# Patient Record
Sex: Female | Born: 1940
Health system: Southern US, Community
[De-identification: ages and names within clinical notes are randomized; demographics above are authoritative.]

## PROBLEM LIST (undated history)

## (undated) DIAGNOSIS — M51369 Other intervertebral disc degeneration, lumbar region without mention of lumbar back pain or lower extremity pain: Secondary | ICD-10-CM

## (undated) DIAGNOSIS — C55 Malignant neoplasm of uterus, part unspecified: Secondary | ICD-10-CM

## (undated) HISTORY — PX: ABDOMINAL HYSTERECTOMY: SHX81

---

## 2008-10-28 ENCOUNTER — Ambulatory Visit: Payer: Self-pay | Admitting: Family Medicine

## 2008-12-04 ENCOUNTER — Ambulatory Visit: Payer: Self-pay | Admitting: Gastroenterology

## 2009-09-23 ENCOUNTER — Ambulatory Visit: Payer: Self-pay | Admitting: Surgery

## 2009-10-19 ENCOUNTER — Ambulatory Visit: Payer: Self-pay | Admitting: Surgery

## 2009-12-19 HISTORY — PX: BREAST EXCISIONAL BIOPSY: SUR124

## 2009-12-19 HISTORY — PX: BREAST BIOPSY: SHX20

## 2010-01-12 ENCOUNTER — Ambulatory Visit: Payer: Self-pay | Admitting: Family Medicine

## 2010-01-14 ENCOUNTER — Ambulatory Visit: Payer: Self-pay | Admitting: Family Medicine

## 2010-01-29 ENCOUNTER — Ambulatory Visit: Payer: Self-pay | Admitting: Surgery

## 2010-02-04 ENCOUNTER — Ambulatory Visit: Payer: Self-pay | Admitting: Surgery

## 2011-01-13 ENCOUNTER — Ambulatory Visit: Payer: Self-pay | Admitting: Family Medicine

## 2012-03-14 ENCOUNTER — Ambulatory Visit: Payer: Self-pay | Admitting: Family Medicine

## 2013-03-15 ENCOUNTER — Ambulatory Visit: Payer: Self-pay | Admitting: Family Medicine

## 2013-09-11 ENCOUNTER — Ambulatory Visit: Payer: Self-pay | Admitting: Family Medicine

## 2014-03-17 ENCOUNTER — Ambulatory Visit: Payer: Self-pay | Admitting: Family Medicine

## 2015-02-13 DIAGNOSIS — E784 Other hyperlipidemia: Secondary | ICD-10-CM | POA: Diagnosis not present

## 2015-02-13 DIAGNOSIS — Z79899 Other long term (current) drug therapy: Secondary | ICD-10-CM | POA: Diagnosis not present

## 2015-02-19 DIAGNOSIS — Z0000 Encounter for general adult medical examination without abnormal findings: Secondary | ICD-10-CM | POA: Diagnosis not present

## 2015-02-19 DIAGNOSIS — Z8739 Personal history of other diseases of the musculoskeletal system and connective tissue: Secondary | ICD-10-CM | POA: Diagnosis not present

## 2015-02-19 DIAGNOSIS — E785 Hyperlipidemia, unspecified: Secondary | ICD-10-CM | POA: Diagnosis not present

## 2015-02-19 DIAGNOSIS — Z1239 Encounter for other screening for malignant neoplasm of breast: Secondary | ICD-10-CM | POA: Diagnosis not present

## 2015-02-26 DIAGNOSIS — Z8739 Personal history of other diseases of the musculoskeletal system and connective tissue: Secondary | ICD-10-CM | POA: Diagnosis not present

## 2015-03-25 ENCOUNTER — Ambulatory Visit
Admit: 2015-03-25 | Disposition: A | Discharge status: Home / Self Care | Payer: Self-pay | Attending: Family Medicine | Admitting: Family Medicine

## 2015-03-25 DIAGNOSIS — Z1231 Encounter for screening mammogram for malignant neoplasm of breast: Secondary | ICD-10-CM | POA: Diagnosis not present

## 2016-02-16 DIAGNOSIS — Z0000 Encounter for general adult medical examination without abnormal findings: Secondary | ICD-10-CM | POA: Diagnosis not present

## 2016-02-16 DIAGNOSIS — E785 Hyperlipidemia, unspecified: Secondary | ICD-10-CM | POA: Diagnosis not present

## 2016-02-23 ENCOUNTER — Other Ambulatory Visit: Payer: Self-pay | Admitting: Family Medicine

## 2016-02-23 DIAGNOSIS — E785 Hyperlipidemia, unspecified: Secondary | ICD-10-CM | POA: Diagnosis not present

## 2016-02-23 DIAGNOSIS — Z23 Encounter for immunization: Secondary | ICD-10-CM | POA: Diagnosis not present

## 2016-02-23 DIAGNOSIS — Z0000 Encounter for general adult medical examination without abnormal findings: Secondary | ICD-10-CM | POA: Diagnosis not present

## 2016-02-23 DIAGNOSIS — Z1231 Encounter for screening mammogram for malignant neoplasm of breast: Secondary | ICD-10-CM

## 2016-02-23 DIAGNOSIS — Z6841 Body Mass Index (BMI) 40.0 and over, adult: Secondary | ICD-10-CM | POA: Diagnosis not present

## 2016-03-25 ENCOUNTER — Other Ambulatory Visit: Payer: Self-pay | Admitting: Family Medicine

## 2016-03-25 ENCOUNTER — Ambulatory Visit
Admission: RE | Admit: 2016-03-25 | Discharge: 2016-03-25 | Disposition: A | Discharge status: Home / Self Care | Payer: Commercial Managed Care - HMO | Source: Ambulatory Visit | Attending: Family Medicine | Admitting: Family Medicine

## 2016-03-25 DIAGNOSIS — Z1231 Encounter for screening mammogram for malignant neoplasm of breast: Secondary | ICD-10-CM | POA: Diagnosis not present

## 2017-04-06 DIAGNOSIS — Z8639 Personal history of other endocrine, nutritional and metabolic disease: Secondary | ICD-10-CM | POA: Diagnosis not present

## 2017-04-06 DIAGNOSIS — Z0000 Encounter for general adult medical examination without abnormal findings: Secondary | ICD-10-CM | POA: Diagnosis not present

## 2017-04-06 DIAGNOSIS — E785 Hyperlipidemia, unspecified: Secondary | ICD-10-CM | POA: Diagnosis not present

## 2017-04-13 DIAGNOSIS — L719 Rosacea, unspecified: Secondary | ICD-10-CM | POA: Diagnosis not present

## 2017-04-13 DIAGNOSIS — E785 Hyperlipidemia, unspecified: Secondary | ICD-10-CM | POA: Diagnosis not present

## 2017-04-13 DIAGNOSIS — Z6841 Body Mass Index (BMI) 40.0 and over, adult: Secondary | ICD-10-CM | POA: Diagnosis not present

## 2017-04-13 DIAGNOSIS — Z0000 Encounter for general adult medical examination without abnormal findings: Secondary | ICD-10-CM | POA: Diagnosis not present

## 2018-01-22 DIAGNOSIS — J209 Acute bronchitis, unspecified: Secondary | ICD-10-CM | POA: Diagnosis not present

## 2018-03-29 ENCOUNTER — Other Ambulatory Visit: Payer: Self-pay | Admitting: Family Medicine

## 2018-03-29 DIAGNOSIS — Z1231 Encounter for screening mammogram for malignant neoplasm of breast: Secondary | ICD-10-CM

## 2018-04-19 ENCOUNTER — Ambulatory Visit
Admission: RE | Admit: 2018-04-19 | Discharge: 2018-04-19 | Disposition: A | Discharge status: Home / Self Care | Payer: Medicare HMO | Source: Ambulatory Visit | Attending: Family Medicine | Admitting: Family Medicine

## 2018-04-19 DIAGNOSIS — Z1231 Encounter for screening mammogram for malignant neoplasm of breast: Secondary | ICD-10-CM | POA: Diagnosis not present

## 2018-05-10 DIAGNOSIS — E785 Hyperlipidemia, unspecified: Secondary | ICD-10-CM | POA: Diagnosis not present

## 2018-05-17 DIAGNOSIS — M545 Low back pain: Secondary | ICD-10-CM | POA: Diagnosis not present

## 2018-05-17 DIAGNOSIS — Z0000 Encounter for general adult medical examination without abnormal findings: Secondary | ICD-10-CM | POA: Diagnosis not present

## 2018-05-17 DIAGNOSIS — Z23 Encounter for immunization: Secondary | ICD-10-CM | POA: Diagnosis not present

## 2018-05-17 DIAGNOSIS — Z6841 Body Mass Index (BMI) 40.0 and over, adult: Secondary | ICD-10-CM | POA: Diagnosis not present

## 2018-05-17 DIAGNOSIS — E785 Hyperlipidemia, unspecified: Secondary | ICD-10-CM | POA: Diagnosis not present

## 2018-08-19 DIAGNOSIS — R350 Frequency of micturition: Secondary | ICD-10-CM | POA: Diagnosis not present

## 2018-08-19 DIAGNOSIS — N3090 Cystitis, unspecified without hematuria: Secondary | ICD-10-CM | POA: Diagnosis not present

## 2018-11-02 DIAGNOSIS — Z6841 Body Mass Index (BMI) 40.0 and over, adult: Secondary | ICD-10-CM | POA: Diagnosis not present

## 2018-11-02 DIAGNOSIS — Z1211 Encounter for screening for malignant neoplasm of colon: Secondary | ICD-10-CM | POA: Diagnosis not present

## 2018-12-04 ENCOUNTER — Emergency Department: Payer: Medicare HMO

## 2018-12-04 ENCOUNTER — Emergency Department
Admission: EM | Admit: 2018-12-04 | Discharge: 2018-12-04 | Disposition: A | Discharge status: Home / Self Care | Payer: Medicare HMO | Attending: Emergency Medicine | Admitting: Emergency Medicine

## 2018-12-04 ENCOUNTER — Encounter: Payer: Self-pay | Admitting: Emergency Medicine

## 2018-12-04 DIAGNOSIS — M79662 Pain in left lower leg: Secondary | ICD-10-CM | POA: Insufficient documentation

## 2018-12-04 DIAGNOSIS — M25562 Pain in left knee: Secondary | ICD-10-CM | POA: Diagnosis not present

## 2018-12-04 DIAGNOSIS — M79605 Pain in left leg: Secondary | ICD-10-CM

## 2018-12-04 LAB — CBC
HCT: 42.5 % (ref 36.0–46.0)
Hemoglobin: 13.7 g/dL (ref 12.0–15.0)
MCH: 30.4 pg (ref 26.0–34.0)
MCHC: 32.2 g/dL (ref 30.0–36.0)
MCV: 94.2 fL (ref 80.0–100.0)
NRBC: 0 % (ref 0.0–0.2)
Platelets: 239 10*3/uL (ref 150–400)
RBC: 4.51 MIL/uL (ref 3.87–5.11)
RDW: 13.8 % (ref 11.5–15.5)
WBC: 7.1 10*3/uL (ref 4.0–10.5)

## 2018-12-04 LAB — COMPREHENSIVE METABOLIC PANEL
ALBUMIN: 3.9 g/dL (ref 3.5–5.0)
ALT: 13 U/L (ref 0–44)
AST: 19 U/L (ref 15–41)
Alkaline Phosphatase: 56 U/L (ref 38–126)
Anion gap: 7 (ref 5–15)
BUN: 12 mg/dL (ref 8–23)
CO2: 27 mmol/L (ref 22–32)
CREATININE: 0.75 mg/dL (ref 0.44–1.00)
Calcium: 9.3 mg/dL (ref 8.9–10.3)
Chloride: 104 mmol/L (ref 98–111)
GFR calc Af Amer: >60 mL/min (ref >60)
GLUCOSE: 106 mg/dL — AB (ref 70–99)
POTASSIUM: 4.1 mmol/L (ref 3.5–5.1)
Sodium: 138 mmol/L (ref 135–145)
Total Bilirubin: 1 mg/dL (ref 0.3–1.2)
Total Protein: 7.3 g/dL (ref 6.5–8.1)

## 2018-12-04 MED ORDER — ACETAMINOPHEN 325 MG PO TABS
650.0000 mg | ORAL_TABLET | Freq: Once | ORAL | Status: AC
  Administered 2018-12-04: 650 mg via ORAL
  Filled 2018-12-04: qty 2

## 2018-12-04 NOTE — ED Notes (Signed)
Pt returns from US.

## 2018-12-04 NOTE — ED Notes (Signed)
First Nurse Note: Patient to ED via WC from [facility] with complaint of left lower leg pain X 5 days.  Alert and oriented, color good.

## 2018-12-04 NOTE — ED Provider Notes (Signed)
[facility] Emergency Department Provider Note   ____________________________________________    I have reviewed the triage vital signs and the nursing notes.   HISTORY  Chief Complaint Knee Pain and Leg Pain     HPI Angela Bass is a 77 y.o. female who presents with complaints of left leg pain.  Patient reports symptoms have been ongoing for about 4 days.  Denies injury to the area.  She complains of pain behind her left knee which radiates down her left calf.  She reports she did fall nearly a month ago down to her knees bilaterally.  But no injury at that time.  Does have a history of arthritis.  Never had a blood clot before.  No recent travel.  Has not take anything for this.  Pain is worse when walking  History reviewed. No pertinent past medical history.  There are no active problems to display for this patient.   Past Surgical History:  Procedure Laterality Date  . BREAST BIOPSY Left 2011   Neg    Prior to Admission medications   Not on File     Allergies Patient has no allergy information on record.  Family History  Problem Relation Age of Onset  . Breast cancer Mother [id number]    Social History Social History   Tobacco Use  . Smoking status: Not on file  Substance Use Topics  . Alcohol use: Not on file  . Drug use: Not on file    Review of Systems  Constitutional: No fever/chills Eyes: No visual changes.  ENT: No sore throat. Cardiovascular: Denies chest pain. Respiratory: Denies shortness of breath.  No pleurisy Gastrointestinal: No abdominal pain.  Genitourinary: Negative for dysuria. Musculoskeletal: As above Skin: Negative for redness Neurological: Negative for headaches or numbness   ____________________________________________   PHYSICAL EXAM:  VITAL SIGNS: ED Triage Vitals  Enc Vitals Group     BP 12/04/18 0846 123/74     Pulse Rate 12/04/18 0846 72     Resp 12/04/18 0846 20     Temp 12/04/18 0846  98.6 F (37 C)     Temp Source 12/04/18 0846 Oral     SpO2 12/04/18 0846 99 %     Weight 12/04/18 0847 82.1 kg (181 lb)     Height 12/04/18 0847 1.397 m (4\' 7" )     Head Circumference --      Peak Flow --      Pain Score 12/04/18 0847 3     Pain Loc --      Pain Edu? --      Excl. in GC? --     Constitutional: Alert and oriented. No acute distress. Pleasant and interactive  Head: Atraumatic. Nose: No congestion/rhinnorhea. Mouth/Throat: Mucous membranes are moist.    Cardiovascular: Normal rate, regular rhythm.Peri [place].  Good peripheral circulation. Respiratory: Normal respiratory effort.  No retractions.   Musculoskeletal: Left leg: No significant swelling or tenderness to the calf.  Warm and well perfused, 2+ DP pulses.  Mild tenderness posterior to the knee, no tenderness to the thigh.  No erythema bruising or lesions Neurologic:  Normal speech and language. No gross focal neurologic deficits are appreciated.  Skin:  Skin is warm, dry and intact. No rash noted. Psychiatric: Mood and affect are normal. Speech and behavior are normal.  ____________________________________________   LABS (all labs ordered are listed, but only abnormal results are displayed)  Labs Reviewed  COMPREHENSIVE METABOLIC PANEL - Abnormal; Notable for the following  components:      Result Value   Glucose, Bld 106 (*)    All other components within normal limits  CBC   ____________________________________________  EKG  None ____________________________________________  RADIOLOGY  Ultrasound ____________________________________________   PROCEDURES  Procedure(s) performed: No  Procedures   Critical Care performed: No ____________________________________________   INITIAL IMPRESSION / ASSESSMENT AND PLAN / ED COURSE  Pertinent labs & imaging results that were available during my care of the patient were reviewed by me and considered in my medical decision making (see chart for  details).  Patient presents with left posterior leg pain, suspicious for Baker's cyst versus DVT, pending ultrasound  Ultrasound negative for DVT or Baker's cyst.  Commended follow-up with orthopedics, supportive care    ____________________________________________   FINAL CLINICAL IMPRESSION(S) / ED DIAGNOSES  Final diagnoses:  Left leg pain  Acute pain of left knee        Note:  This document was prepared using Dragon voice recognition software and may include unintentional dictation errors.    Jene Every, MD 12/04/18 1210

## 2018-12-04 NOTE — ED Notes (Signed)
Patient transported to Ultrasound 

## 2018-12-04 NOTE — ED Notes (Signed)
Responded to call bell. Pt needed assistance with toileting. Pt able to ambulate to commode with assistance. Pt returned to bed and resting comfortably.

## 2018-12-04 NOTE — ED Triage Notes (Signed)
Pt reports severe pain in her left knee on Thursday on the back of her knee and then radiates down her leg when she walks. Pt states when sitting the pain does not bother her but when walking it hurts. Pt denies recent injuries, reports she fell on her knees a few weeks ago but does not think it is that. No redness or warmth noted to left leg. Pt reports went to [facility] and was told she needed to come to the ED.

## 2018-12-04 NOTE — ED Notes (Signed)
Pt presents to ED from home with c/c of L side lower extremity pain. Pt indicates that pain is located primarily behind her L knee and radiates down her L calf. Pain described as aching and rated 10/10. She denies distal numbness/tingling. Pt is ambulatory without assistance. Pt states she fell several days ago but was able to get up immediately after. Pt has taken tylenol for the last two days for pain. Last dose was 650mg  at 1900 yesterday afternoon.

## 2018-12-29 DIAGNOSIS — Z9849 Cataract extraction status, unspecified eye: Secondary | ICD-10-CM | POA: Diagnosis not present

## 2018-12-29 DIAGNOSIS — H04123 Dry eye syndrome of bilateral lacrimal glands: Secondary | ICD-10-CM | POA: Diagnosis not present

## 2018-12-29 DIAGNOSIS — H11153 Pinguecula, bilateral: Secondary | ICD-10-CM | POA: Diagnosis not present

## 2018-12-29 DIAGNOSIS — H5203 Hypermetropia, bilateral: Secondary | ICD-10-CM | POA: Diagnosis not present

## 2018-12-29 DIAGNOSIS — H52223 Regular astigmatism, bilateral: Secondary | ICD-10-CM | POA: Diagnosis not present

## 2018-12-29 DIAGNOSIS — H524 Presbyopia: Secondary | ICD-10-CM | POA: Diagnosis not present

## 2018-12-29 DIAGNOSIS — Z961 Presence of intraocular lens: Secondary | ICD-10-CM | POA: Diagnosis not present

## 2019-03-12 ENCOUNTER — Other Ambulatory Visit: Payer: Self-pay | Admitting: Family Medicine

## 2019-03-12 DIAGNOSIS — Z1231 Encounter for screening mammogram for malignant neoplasm of breast: Secondary | ICD-10-CM

## 2019-05-16 ENCOUNTER — Other Ambulatory Visit: Payer: Self-pay

## 2019-05-16 ENCOUNTER — Ambulatory Visit
Admission: RE | Admit: 2019-05-16 | Discharge: 2019-05-16 | Disposition: A | Discharge status: Home / Self Care | Payer: Medicare HMO | Source: Ambulatory Visit | Attending: Family Medicine | Admitting: Family Medicine

## 2019-05-16 DIAGNOSIS — Z1231 Encounter for screening mammogram for malignant neoplasm of breast: Secondary | ICD-10-CM | POA: Diagnosis not present

## 2019-05-17 DIAGNOSIS — Z0000 Encounter for general adult medical examination without abnormal findings: Secondary | ICD-10-CM | POA: Diagnosis not present

## 2019-05-17 DIAGNOSIS — E785 Hyperlipidemia, unspecified: Secondary | ICD-10-CM | POA: Diagnosis not present

## 2019-05-22 DIAGNOSIS — E785 Hyperlipidemia, unspecified: Secondary | ICD-10-CM | POA: Diagnosis not present

## 2019-05-22 DIAGNOSIS — L719 Rosacea, unspecified: Secondary | ICD-10-CM | POA: Diagnosis not present

## 2019-05-22 DIAGNOSIS — M25562 Pain in left knee: Secondary | ICD-10-CM | POA: Diagnosis not present

## 2020-03-27 DIAGNOSIS — E785 Hyperlipidemia, unspecified: Secondary | ICD-10-CM | POA: Diagnosis not present

## 2020-04-02 DIAGNOSIS — Z6841 Body Mass Index (BMI) 40.0 and over, adult: Secondary | ICD-10-CM | POA: Diagnosis not present

## 2020-04-02 DIAGNOSIS — M545 Low back pain: Secondary | ICD-10-CM | POA: Diagnosis not present

## 2020-04-02 DIAGNOSIS — E785 Hyperlipidemia, unspecified: Secondary | ICD-10-CM | POA: Diagnosis not present

## 2020-04-02 DIAGNOSIS — Z0000 Encounter for general adult medical examination without abnormal findings: Secondary | ICD-10-CM | POA: Diagnosis not present

## 2020-06-02 DIAGNOSIS — M5442 Lumbago with sciatica, left side: Secondary | ICD-10-CM | POA: Diagnosis not present

## 2020-06-18 ENCOUNTER — Other Ambulatory Visit: Payer: Self-pay | Admitting: Family Medicine

## 2020-06-18 DIAGNOSIS — Z1231 Encounter for screening mammogram for malignant neoplasm of breast: Secondary | ICD-10-CM

## 2020-07-03 ENCOUNTER — Ambulatory Visit
Admission: RE | Admit: 2020-07-03 | Discharge: 2020-07-03 | Disposition: A | Discharge status: Home / Self Care | Payer: Medicare HMO | Source: Ambulatory Visit | Attending: Family Medicine | Admitting: Family Medicine

## 2020-07-03 DIAGNOSIS — Z1231 Encounter for screening mammogram for malignant neoplasm of breast: Secondary | ICD-10-CM | POA: Diagnosis not present

## 2020-07-27 DIAGNOSIS — H524 Presbyopia: Secondary | ICD-10-CM | POA: Diagnosis not present

## 2021-04-13 IMAGING — MG DIGITAL SCREENING BILAT W/ TOMO W/ CAD
6 of 10 series · 6 of 30 positions shown · non-contrast
Comparison: Previous exam(s).

CLINICAL DATA: Screening.

EXAM:
DIGITAL SCREENING BILATERAL MAMMOGRAM WITH TOMO AND CAD

[L CC synth-2D]
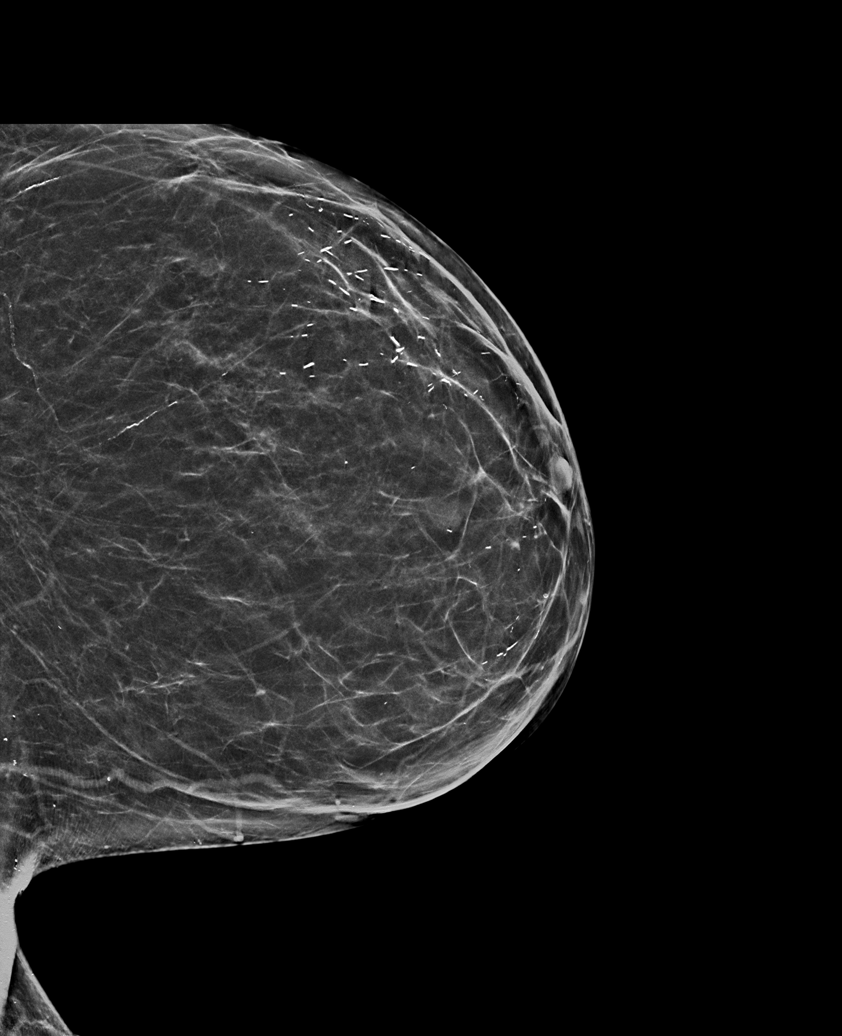

[R MLO synth-2D]
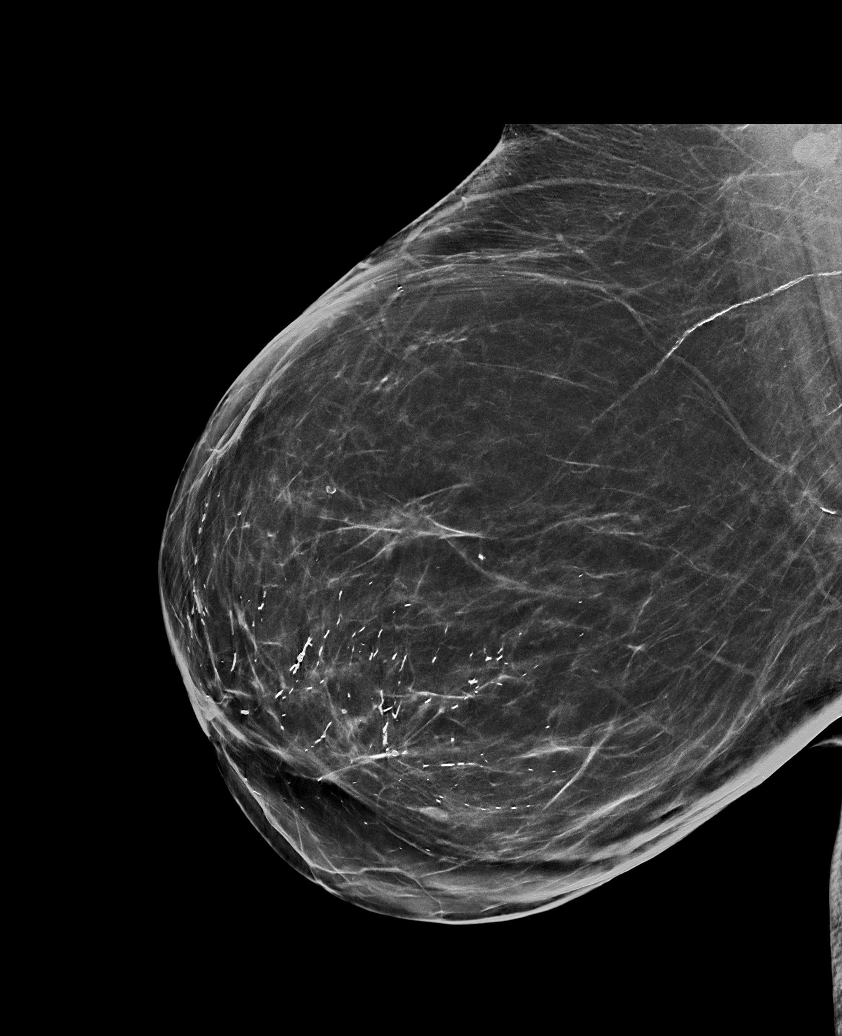

[L MLO synth-2D (1 of 2)]
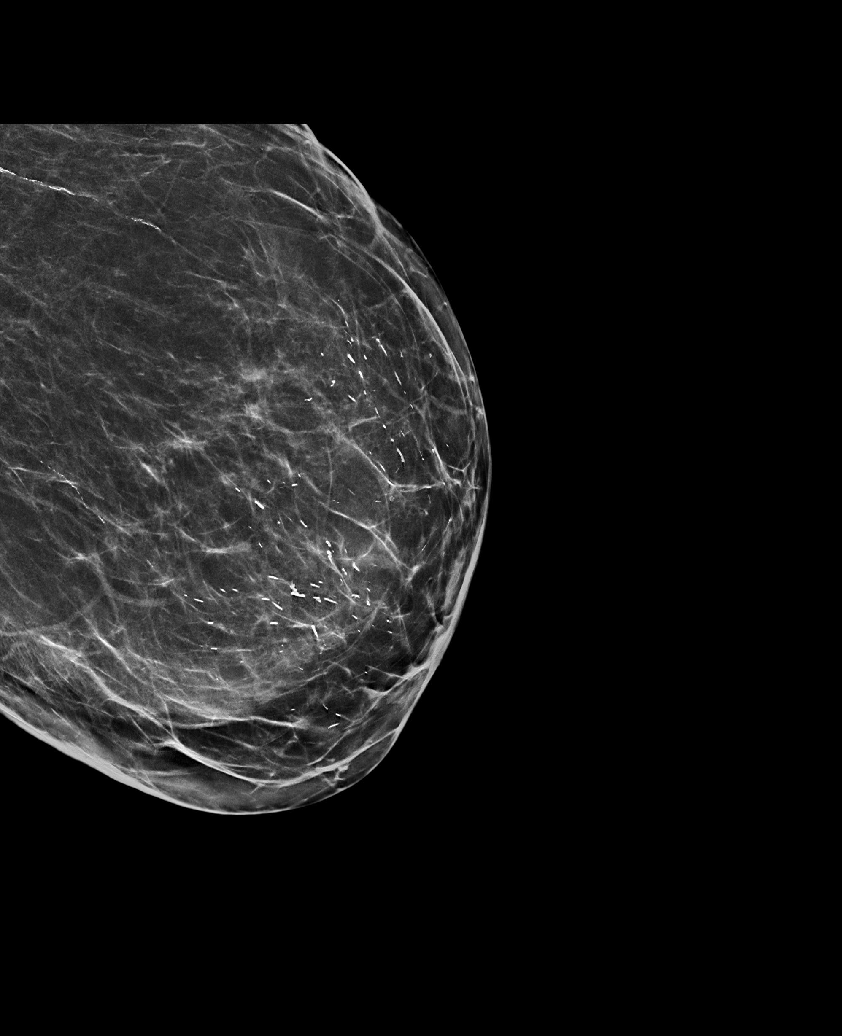

[R CC synth-2D]
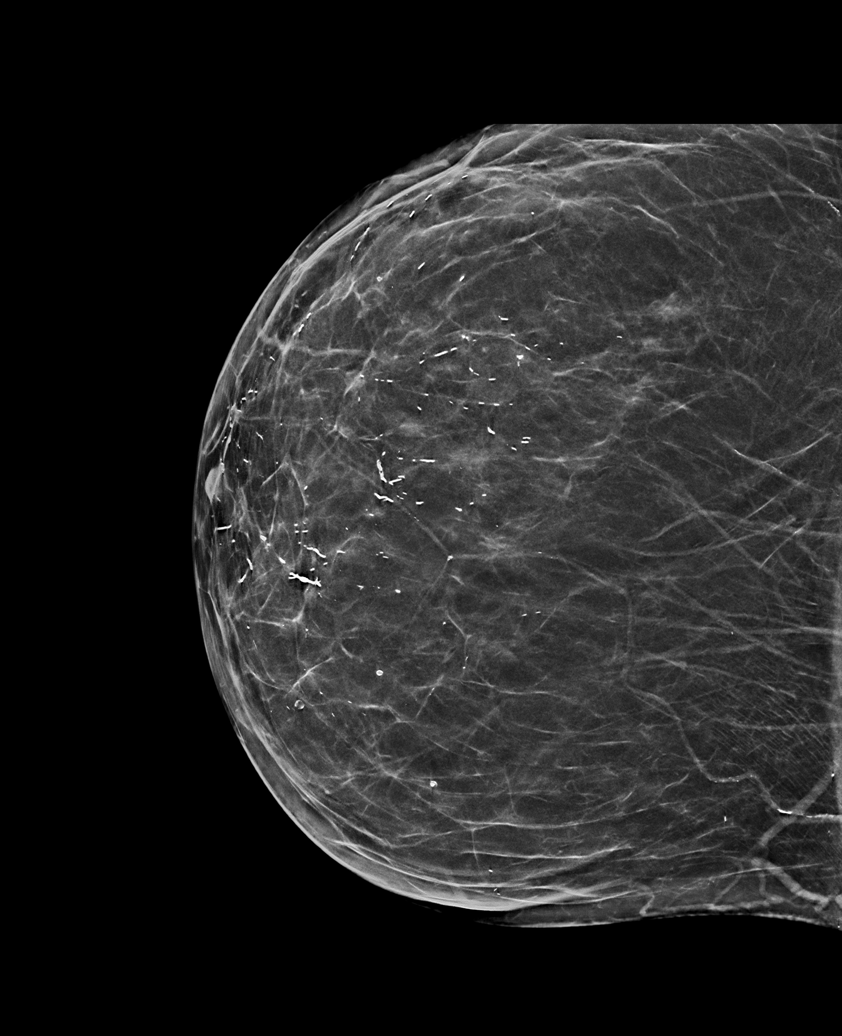

[L MLO synth-2D (2 of 2)]
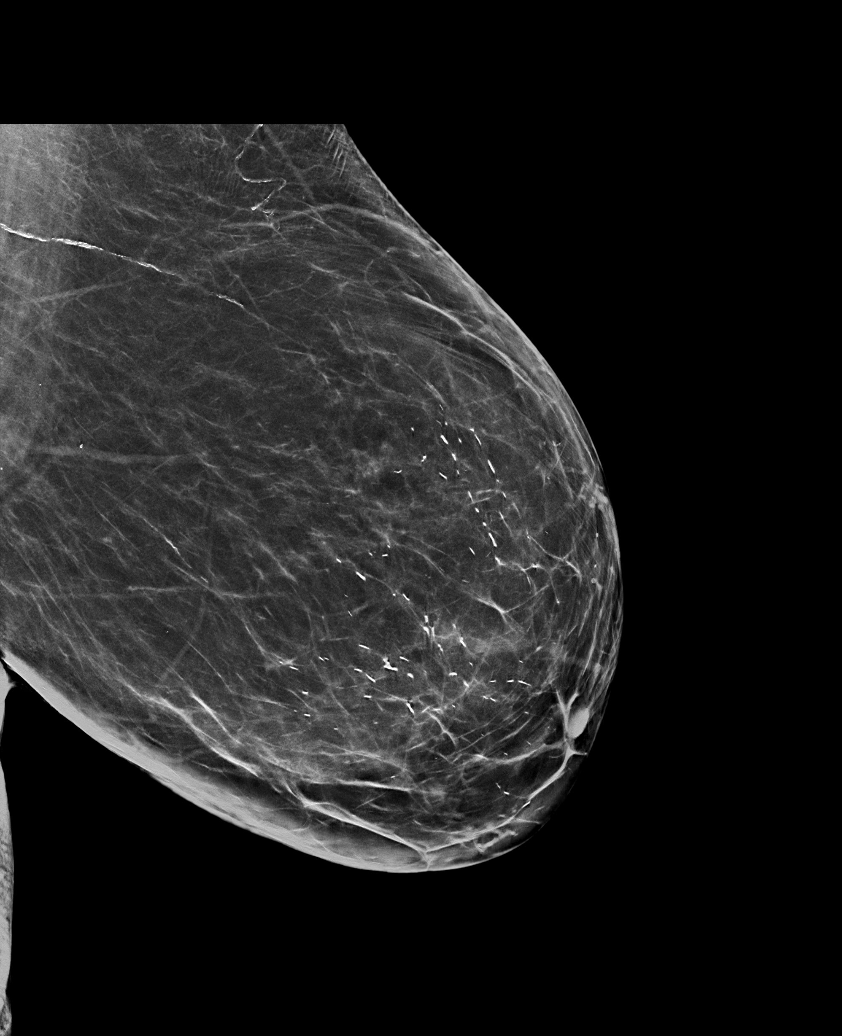

[R MLO tomo · tomo slice 39/78.0]
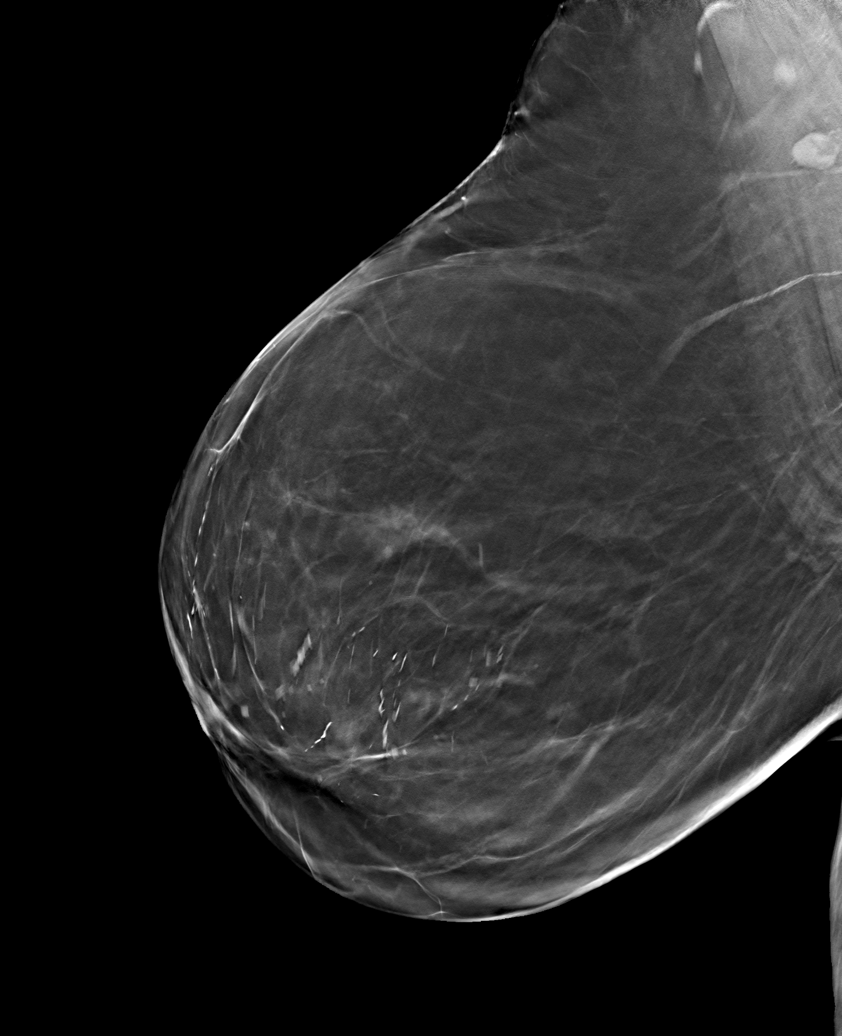

[6 of 30 positions shown; findings below may reference images not displayed]

ACR Breast Density Category b: There are scattered areas of
fibroglandular density.
FINDINGS: There are no findings suspicious for malignancy. Images were
processed with CAD.
IMPRESSION: No mammographic evidence of malignancy. A result letter of this
screening mammogram will be mailed directly to the patient.

RECOMMENDATION:
Screening mammogram in one year. (Code:[id number])

BI-RADS CATEGORY  1: Negative.

## 2021-08-10 ENCOUNTER — Other Ambulatory Visit: Payer: Self-pay | Admitting: Family Medicine

## 2021-08-26 DIAGNOSIS — E785 Hyperlipidemia, unspecified: Secondary | ICD-10-CM | POA: Diagnosis not present

## 2021-09-01 DIAGNOSIS — R5381 Other malaise: Secondary | ICD-10-CM | POA: Diagnosis not present

## 2021-09-01 DIAGNOSIS — Z0000 Encounter for general adult medical examination without abnormal findings: Secondary | ICD-10-CM | POA: Diagnosis not present

## 2021-09-01 DIAGNOSIS — R0602 Shortness of breath: Secondary | ICD-10-CM | POA: Diagnosis not present

## 2021-09-01 DIAGNOSIS — E785 Hyperlipidemia, unspecified: Secondary | ICD-10-CM | POA: Diagnosis not present

## 2021-09-01 DIAGNOSIS — R1013 Epigastric pain: Secondary | ICD-10-CM | POA: Diagnosis not present

## 2021-09-01 DIAGNOSIS — D649 Anemia, unspecified: Secondary | ICD-10-CM | POA: Diagnosis not present

## 2021-09-01 DIAGNOSIS — R5383 Other fatigue: Secondary | ICD-10-CM | POA: Diagnosis not present

## 2021-09-01 DIAGNOSIS — L719 Rosacea, unspecified: Secondary | ICD-10-CM | POA: Diagnosis not present

## 2021-09-07 DIAGNOSIS — D649 Anemia, unspecified: Secondary | ICD-10-CM | POA: Diagnosis not present

## 2021-11-16 DIAGNOSIS — D649 Anemia, unspecified: Secondary | ICD-10-CM | POA: Diagnosis not present

## 2021-11-16 DIAGNOSIS — R1013 Epigastric pain: Secondary | ICD-10-CM | POA: Diagnosis not present

## 2022-08-04 DIAGNOSIS — Z832 Family history of diseases of the blood and blood-forming organs and certain disorders involving the immune mechanism: Secondary | ICD-10-CM | POA: Diagnosis not present

## 2022-08-04 DIAGNOSIS — M5442 Lumbago with sciatica, left side: Secondary | ICD-10-CM | POA: Diagnosis not present

## 2022-08-04 DIAGNOSIS — K219 Gastro-esophageal reflux disease without esophagitis: Secondary | ICD-10-CM | POA: Diagnosis not present

## 2022-08-04 DIAGNOSIS — K449 Diaphragmatic hernia without obstruction or gangrene: Secondary | ICD-10-CM | POA: Diagnosis not present

## 2022-08-04 DIAGNOSIS — D509 Iron deficiency anemia, unspecified: Secondary | ICD-10-CM | POA: Diagnosis not present

## 2022-08-04 DIAGNOSIS — E785 Hyperlipidemia, unspecified: Secondary | ICD-10-CM | POA: Diagnosis not present

## 2022-08-04 DIAGNOSIS — R0602 Shortness of breath: Secondary | ICD-10-CM | POA: Diagnosis not present

## 2022-08-29 ENCOUNTER — Other Ambulatory Visit: Payer: Self-pay | Admitting: Internal Medicine

## 2022-08-29 DIAGNOSIS — R9389 Abnormal findings on diagnostic imaging of other specified body structures: Secondary | ICD-10-CM | POA: Diagnosis not present

## 2022-08-29 DIAGNOSIS — R079 Chest pain, unspecified: Secondary | ICD-10-CM

## 2022-08-29 DIAGNOSIS — G4733 Obstructive sleep apnea (adult) (pediatric): Secondary | ICD-10-CM | POA: Diagnosis not present

## 2022-08-29 DIAGNOSIS — R0602 Shortness of breath: Secondary | ICD-10-CM | POA: Diagnosis not present

## 2022-09-07 DIAGNOSIS — Z1331 Encounter for screening for depression: Secondary | ICD-10-CM | POA: Diagnosis not present

## 2022-09-07 DIAGNOSIS — M545 Low back pain, unspecified: Secondary | ICD-10-CM | POA: Diagnosis not present

## 2022-09-07 DIAGNOSIS — Z6841 Body Mass Index (BMI) 40.0 and over, adult: Secondary | ICD-10-CM | POA: Diagnosis not present

## 2022-09-07 DIAGNOSIS — R0602 Shortness of breath: Secondary | ICD-10-CM | POA: Diagnosis not present

## 2022-09-07 DIAGNOSIS — Z0000 Encounter for general adult medical examination without abnormal findings: Secondary | ICD-10-CM | POA: Diagnosis not present

## 2022-09-07 DIAGNOSIS — M79605 Pain in left leg: Secondary | ICD-10-CM | POA: Diagnosis not present

## 2022-09-07 DIAGNOSIS — Z23 Encounter for immunization: Secondary | ICD-10-CM | POA: Diagnosis not present

## 2022-09-07 DIAGNOSIS — E785 Hyperlipidemia, unspecified: Secondary | ICD-10-CM | POA: Diagnosis not present

## 2022-09-09 ENCOUNTER — Telehealth (HOSPITAL_COMMUNITY): Payer: Self-pay | Admitting: *Deleted

## 2022-09-09 ENCOUNTER — Other Ambulatory Visit (HOSPITAL_COMMUNITY): Payer: Self-pay | Admitting: *Deleted

## 2022-09-09 DIAGNOSIS — M545 Low back pain, unspecified: Secondary | ICD-10-CM | POA: Diagnosis not present

## 2022-09-09 MED ORDER — IVABRADINE HCL 7.5 MG PO TABS: ORAL_TABLET | ORAL | 0 refills | Status: DC

## 2022-09-09 MED ORDER — METOPROLOL TARTRATE 100 MG PO TABS: ORAL_TABLET | ORAL | 0 refills | Status: DC

## 2022-09-09 NOTE — Telephone Encounter (Signed)
Reaching out to patient to offer assistance regarding upcoming cardiac imaging study; pt verbalizes understanding of appt date/time, parking situation and where to check in, medications ordered, and verified current allergies; name and call back number provided for further questions should they arise  Gordy Clement RN Navigator Cardiac Imaging [facility] [phone] office [phone] cell  Patient to take 15mg  ivabradine + 100mg  metoprolol tartrate two hours prior to her cardiac CT scan.

## 2022-09-12 ENCOUNTER — Ambulatory Visit
Admission: RE | Admit: 2022-09-12 | Discharge: 2022-09-12 | Disposition: A | Discharge status: Home / Self Care | Payer: Medicare HMO | Source: Ambulatory Visit | Attending: Internal Medicine | Admitting: Internal Medicine

## 2022-09-12 DIAGNOSIS — R9389 Abnormal findings on diagnostic imaging of other specified body structures: Secondary | ICD-10-CM | POA: Diagnosis not present

## 2022-09-12 DIAGNOSIS — R079 Chest pain, unspecified: Secondary | ICD-10-CM | POA: Insufficient documentation

## 2022-09-12 DIAGNOSIS — I2510 Atherosclerotic heart disease of native coronary artery without angina pectoris: Secondary | ICD-10-CM | POA: Insufficient documentation

## 2022-09-12 MED ORDER — IOHEXOL 350 MG/ML SOLN
100.0000 mL | Freq: Once | INTRAVENOUS | Status: AC | PRN
  Administered 2022-09-12: 100 mL via INTRAVENOUS

## 2022-09-12 MED ORDER — NITROGLYCERIN 0.4 MG SL SUBL
0.8000 mg | SUBLINGUAL_TABLET | Freq: Once | SUBLINGUAL | Status: AC
  Administered 2022-09-12: 0.8 mg via SUBLINGUAL

## 2022-09-12 NOTE — Progress Notes (Signed)
Patient tolerated procedure well. Ambulate w/o difficulty. Denies any lightheadedness or being dizzy. Pt denies any pain at this time. Sitting in chair, pt is encouraged to drink additional water throughout the day and reason explained to patient. Patient verbalized understanding and all questions answered. ABC intact. No further needs at this time. Discharge from procedure area w/o issues.  

## 2022-09-14 ENCOUNTER — Other Ambulatory Visit: Payer: Self-pay | Admitting: Family Medicine

## 2022-09-14 DIAGNOSIS — M79605 Pain in left leg: Secondary | ICD-10-CM

## 2022-09-14 DIAGNOSIS — R937 Abnormal findings on diagnostic imaging of other parts of musculoskeletal system: Secondary | ICD-10-CM

## 2022-09-19 ENCOUNTER — Other Ambulatory Visit: Payer: Self-pay | Admitting: Family Medicine

## 2022-09-19 ENCOUNTER — Ambulatory Visit
Admission: RE | Admit: 2022-09-19 | Discharge: 2022-09-19 | Disposition: A | Discharge status: Home / Self Care | Payer: Medicare HMO | Source: Ambulatory Visit | Attending: Family Medicine | Admitting: Family Medicine

## 2022-09-19 DIAGNOSIS — M79605 Pain in left leg: Secondary | ICD-10-CM | POA: Diagnosis not present

## 2022-09-19 DIAGNOSIS — M549 Dorsalgia, unspecified: Secondary | ICD-10-CM | POA: Diagnosis not present

## 2022-09-19 DIAGNOSIS — M545 Low back pain, unspecified: Secondary | ICD-10-CM | POA: Diagnosis not present

## 2022-09-19 DIAGNOSIS — R937 Abnormal findings on diagnostic imaging of other parts of musculoskeletal system: Secondary | ICD-10-CM | POA: Diagnosis not present

## 2022-09-19 DIAGNOSIS — Z1231 Encounter for screening mammogram for malignant neoplasm of breast: Secondary | ICD-10-CM

## 2022-09-20 DIAGNOSIS — R0602 Shortness of breath: Secondary | ICD-10-CM | POA: Diagnosis not present

## 2022-09-20 DIAGNOSIS — G479 Sleep disorder, unspecified: Secondary | ICD-10-CM | POA: Diagnosis not present

## 2022-09-20 DIAGNOSIS — Z6841 Body Mass Index (BMI) 40.0 and over, adult: Secondary | ICD-10-CM | POA: Diagnosis not present

## 2022-09-28 DIAGNOSIS — R0602 Shortness of breath: Secondary | ICD-10-CM | POA: Diagnosis not present

## 2022-10-05 DIAGNOSIS — G479 Sleep disorder, unspecified: Secondary | ICD-10-CM | POA: Diagnosis not present

## 2022-10-05 DIAGNOSIS — R0609 Other forms of dyspnea: Secondary | ICD-10-CM | POA: Diagnosis not present

## 2022-10-05 DIAGNOSIS — Z6841 Body Mass Index (BMI) 40.0 and over, adult: Secondary | ICD-10-CM | POA: Diagnosis not present

## 2022-10-05 DIAGNOSIS — R058 Other specified cough: Secondary | ICD-10-CM | POA: Diagnosis not present

## 2022-10-07 DIAGNOSIS — R0609 Other forms of dyspnea: Secondary | ICD-10-CM | POA: Diagnosis not present

## 2022-10-14 ENCOUNTER — Ambulatory Visit
Admission: RE | Admit: 2022-10-14 | Discharge: 2022-10-14 | Disposition: A | Discharge status: Home / Self Care | Payer: Medicare HMO | Source: Ambulatory Visit | Attending: Family Medicine | Admitting: Family Medicine

## 2022-10-14 DIAGNOSIS — Z1231 Encounter for screening mammogram for malignant neoplasm of breast: Secondary | ICD-10-CM | POA: Diagnosis not present

## 2022-10-20 DIAGNOSIS — G4733 Obstructive sleep apnea (adult) (pediatric): Secondary | ICD-10-CM | POA: Diagnosis not present

## 2022-11-01 DIAGNOSIS — R0602 Shortness of breath: Secondary | ICD-10-CM | POA: Diagnosis not present

## 2022-11-01 DIAGNOSIS — G4734 Idiopathic sleep related nonobstructive alveolar hypoventilation: Secondary | ICD-10-CM | POA: Diagnosis not present

## 2022-11-01 DIAGNOSIS — G4733 Obstructive sleep apnea (adult) (pediatric): Secondary | ICD-10-CM | POA: Diagnosis not present

## 2022-11-09 DIAGNOSIS — Z6841 Body Mass Index (BMI) 40.0 and over, adult: Secondary | ICD-10-CM | POA: Diagnosis not present

## 2022-11-09 DIAGNOSIS — R0602 Shortness of breath: Secondary | ICD-10-CM | POA: Diagnosis not present

## 2022-11-09 DIAGNOSIS — R9389 Abnormal findings on diagnostic imaging of other specified body structures: Secondary | ICD-10-CM | POA: Diagnosis not present

## 2022-11-09 DIAGNOSIS — R079 Chest pain, unspecified: Secondary | ICD-10-CM | POA: Diagnosis not present

## 2022-11-09 DIAGNOSIS — G4733 Obstructive sleep apnea (adult) (pediatric): Secondary | ICD-10-CM | POA: Diagnosis not present

## 2022-11-14 DIAGNOSIS — M4807 Spinal stenosis, lumbosacral region: Secondary | ICD-10-CM | POA: Diagnosis not present

## 2022-11-14 DIAGNOSIS — M47816 Spondylosis without myelopathy or radiculopathy, lumbar region: Secondary | ICD-10-CM | POA: Diagnosis not present

## 2022-11-14 DIAGNOSIS — Z6841 Body Mass Index (BMI) 40.0 and over, adult: Secondary | ICD-10-CM | POA: Diagnosis not present

## 2022-11-15 ENCOUNTER — Other Ambulatory Visit: Payer: Self-pay | Admitting: Surgery

## 2022-11-15 DIAGNOSIS — M4807 Spinal stenosis, lumbosacral region: Secondary | ICD-10-CM

## 2022-11-15 DIAGNOSIS — M47816 Spondylosis without myelopathy or radiculopathy, lumbar region: Secondary | ICD-10-CM

## 2022-11-28 ENCOUNTER — Ambulatory Visit
Admission: RE | Admit: 2022-11-28 | Discharge: 2022-11-28 | Disposition: A | Discharge status: Home / Self Care | Payer: Medicare HMO | Source: Ambulatory Visit | Attending: Surgery | Admitting: Surgery

## 2022-11-28 DIAGNOSIS — M545 Low back pain, unspecified: Secondary | ICD-10-CM | POA: Diagnosis not present

## 2022-11-28 DIAGNOSIS — M4807 Spinal stenosis, lumbosacral region: Secondary | ICD-10-CM

## 2022-11-28 DIAGNOSIS — M47816 Spondylosis without myelopathy or radiculopathy, lumbar region: Secondary | ICD-10-CM

## 2022-12-14 DIAGNOSIS — M545 Low back pain, unspecified: Secondary | ICD-10-CM | POA: Diagnosis not present

## 2023-01-04 DIAGNOSIS — M5416 Radiculopathy, lumbar region: Secondary | ICD-10-CM | POA: Diagnosis not present

## 2023-01-04 DIAGNOSIS — M48062 Spinal stenosis, lumbar region with neurogenic claudication: Secondary | ICD-10-CM | POA: Diagnosis not present

## 2023-01-13 DIAGNOSIS — R300 Dysuria: Secondary | ICD-10-CM | POA: Diagnosis not present

## 2023-01-13 DIAGNOSIS — N10 Acute tubulo-interstitial nephritis: Secondary | ICD-10-CM | POA: Diagnosis not present

## 2023-01-13 DIAGNOSIS — M5442 Lumbago with sciatica, left side: Secondary | ICD-10-CM | POA: Diagnosis not present

## 2023-01-27 DIAGNOSIS — M47816 Spondylosis without myelopathy or radiculopathy, lumbar region: Secondary | ICD-10-CM | POA: Diagnosis not present

## 2023-01-27 DIAGNOSIS — M5442 Lumbago with sciatica, left side: Secondary | ICD-10-CM | POA: Diagnosis not present

## 2023-01-27 DIAGNOSIS — Z6841 Body Mass Index (BMI) 40.0 and over, adult: Secondary | ICD-10-CM | POA: Diagnosis not present

## 2023-02-01 DIAGNOSIS — M5416 Radiculopathy, lumbar region: Secondary | ICD-10-CM | POA: Diagnosis not present

## 2023-02-01 DIAGNOSIS — M48062 Spinal stenosis, lumbar region with neurogenic claudication: Secondary | ICD-10-CM | POA: Diagnosis not present

## 2023-05-03 DIAGNOSIS — G4733 Obstructive sleep apnea (adult) (pediatric): Secondary | ICD-10-CM | POA: Diagnosis not present

## 2023-05-03 DIAGNOSIS — R0609 Other forms of dyspnea: Secondary | ICD-10-CM | POA: Diagnosis not present

## 2023-12-13 ENCOUNTER — Emergency Department: Payer: Medicare HMO

## 2023-12-13 ENCOUNTER — Inpatient Hospital Stay
Admission: EM | Admit: 2023-12-13 | Discharge: 2023-12-18 | DRG: 327 | Disposition: A | Discharge status: Home / Self Care | Payer: Medicare HMO | Attending: Internal Medicine | Admitting: Internal Medicine

## 2023-12-13 ENCOUNTER — Other Ambulatory Visit: Payer: Self-pay

## 2023-12-13 DIAGNOSIS — I10 Essential (primary) hypertension: Secondary | ICD-10-CM | POA: Diagnosis present

## 2023-12-13 DIAGNOSIS — K56609 Unspecified intestinal obstruction, unspecified as to partial versus complete obstruction: Secondary | ICD-10-CM | POA: Diagnosis not present

## 2023-12-13 DIAGNOSIS — I878 Other specified disorders of veins: Secondary | ICD-10-CM | POA: Diagnosis not present

## 2023-12-13 DIAGNOSIS — K449 Diaphragmatic hernia without obstruction or gangrene: Secondary | ICD-10-CM

## 2023-12-13 DIAGNOSIS — R112 Nausea with vomiting, unspecified: Secondary | ICD-10-CM | POA: Diagnosis not present

## 2023-12-13 DIAGNOSIS — Z8542 Personal history of malignant neoplasm of other parts of uterus: Secondary | ICD-10-CM | POA: Diagnosis not present

## 2023-12-13 DIAGNOSIS — R1110 Vomiting, unspecified: Secondary | ICD-10-CM | POA: Diagnosis present

## 2023-12-13 DIAGNOSIS — J449 Chronic obstructive pulmonary disease, unspecified: Secondary | ICD-10-CM | POA: Diagnosis present

## 2023-12-13 DIAGNOSIS — E7800 Pure hypercholesterolemia, unspecified: Secondary | ICD-10-CM | POA: Diagnosis present

## 2023-12-13 DIAGNOSIS — K311 Adult hypertrophic pyloric stenosis: Secondary | ICD-10-CM | POA: Diagnosis not present

## 2023-12-13 DIAGNOSIS — Z803 Family history of malignant neoplasm of breast: Secondary | ICD-10-CM | POA: Diagnosis not present

## 2023-12-13 DIAGNOSIS — Z6841 Body Mass Index (BMI) 40.0 and over, adult: Secondary | ICD-10-CM

## 2023-12-13 DIAGNOSIS — K219 Gastro-esophageal reflux disease without esophagitis: Secondary | ICD-10-CM | POA: Diagnosis present

## 2023-12-13 DIAGNOSIS — I2510 Atherosclerotic heart disease of native coronary artery without angina pectoris: Secondary | ICD-10-CM | POA: Diagnosis present

## 2023-12-13 DIAGNOSIS — G8929 Other chronic pain: Secondary | ICD-10-CM | POA: Diagnosis present

## 2023-12-13 DIAGNOSIS — E876 Hypokalemia: Secondary | ICD-10-CM | POA: Insufficient documentation

## 2023-12-13 DIAGNOSIS — I700 Atherosclerosis of aorta: Secondary | ICD-10-CM | POA: Diagnosis not present

## 2023-12-13 DIAGNOSIS — R1111 Vomiting without nausea: Secondary | ICD-10-CM | POA: Diagnosis not present

## 2023-12-13 DIAGNOSIS — Z90710 Acquired absence of both cervix and uterus: Secondary | ICD-10-CM | POA: Diagnosis not present

## 2023-12-13 DIAGNOSIS — R918 Other nonspecific abnormal finding of lung field: Secondary | ICD-10-CM | POA: Diagnosis not present

## 2023-12-13 DIAGNOSIS — J9811 Atelectasis: Secondary | ICD-10-CM | POA: Diagnosis not present

## 2023-12-13 DIAGNOSIS — K440 Diaphragmatic hernia with obstruction, without gangrene: Principal | ICD-10-CM

## 2023-12-13 DIAGNOSIS — Z4682 Encounter for fitting and adjustment of non-vascular catheter: Secondary | ICD-10-CM | POA: Diagnosis not present

## 2023-12-13 DIAGNOSIS — R110 Nausea: Secondary | ICD-10-CM | POA: Diagnosis not present

## 2023-12-13 DIAGNOSIS — K3189 Other diseases of stomach and duodenum: Secondary | ICD-10-CM

## 2023-12-13 DIAGNOSIS — R509 Fever, unspecified: Secondary | ICD-10-CM | POA: Diagnosis not present

## 2023-12-13 DIAGNOSIS — E860 Dehydration: Secondary | ICD-10-CM | POA: Diagnosis not present

## 2023-12-13 DIAGNOSIS — N3289 Other specified disorders of bladder: Secondary | ICD-10-CM | POA: Diagnosis not present

## 2023-12-13 DIAGNOSIS — E669 Obesity, unspecified: Secondary | ICD-10-CM | POA: Diagnosis not present

## 2023-12-13 DIAGNOSIS — K429 Umbilical hernia without obstruction or gangrene: Secondary | ICD-10-CM | POA: Diagnosis not present

## 2023-12-13 HISTORY — DX: Malignant neoplasm of uterus, part unspecified: C55

## 2023-12-13 HISTORY — DX: Other intervertebral disc degeneration, lumbar region without mention of lumbar back pain or lower extremity pain: M51.369

## 2023-12-13 LAB — COMPREHENSIVE METABOLIC PANEL
ALT: 14 U/L (ref 0–44)
AST: 27 U/L (ref 15–41)
Albumin: 4 g/dL (ref 3.5–5.0)
Alkaline Phosphatase: 65 U/L (ref 38–126)
Anion gap: 17 — ABNORMAL HIGH (ref 5–15)
BUN: 10 mg/dL (ref 8–23)
CO2: 25 mmol/L (ref 22–32)
Calcium: 9.2 mg/dL (ref 8.9–10.3)
Chloride: 96 mmol/L — ABNORMAL LOW (ref 98–111)
Creatinine, Ser: 0.74 mg/dL (ref 0.44–1.00)
GFR, Estimated: >60 mL/min (ref >60)
Glucose, Bld: 142 mg/dL — ABNORMAL HIGH (ref 70–99)
Potassium: 3.7 mmol/L (ref 3.5–5.1)
Sodium: 138 mmol/L (ref 135–145)
Total Bilirubin: 1.4 mg/dL — ABNORMAL HIGH (ref <1.2)
Total Protein: 7.3 g/dL (ref 6.5–8.1)

## 2023-12-13 LAB — URINALYSIS, ROUTINE W REFLEX MICROSCOPIC
Bilirubin Urine: NEGATIVE
Glucose, UA: NEGATIVE mg/dL
Hgb urine dipstick: NEGATIVE
Ketones, ur: 20 mg/dL — AB
Leukocytes,Ua: NEGATIVE
Nitrite: NEGATIVE
Protein, ur: 100 mg/dL — AB
Specific Gravity, Urine: 1.024 (ref 1.005–1.030)
pH: 5 (ref 5.0–8.0)

## 2023-12-13 LAB — CBC
HCT: 37.6 % (ref 36.0–46.0)
Hemoglobin: 13 g/dL (ref 12.0–15.0)
MCH: 32.7 pg (ref 26.0–34.0)
MCHC: 34.6 g/dL (ref 30.0–36.0)
MCV: 94.7 fL (ref 80.0–100.0)
Platelets: 264 10*3/uL (ref 150–400)
RBC: 3.97 MIL/uL (ref 3.87–5.11)
RDW: 13.3 % (ref 11.5–15.5)
WBC: 11.6 10*3/uL — ABNORMAL HIGH (ref 4.0–10.5)
nRBC: 0 % (ref 0.0–0.2)

## 2023-12-13 LAB — TSH: TSH: 3.452 u[IU]/mL (ref 0.350–4.500)

## 2023-12-13 LAB — LIPASE, BLOOD: Lipase: 26 U/L (ref 11–51)

## 2023-12-13 LAB — LACTIC ACID, PLASMA: Lactic Acid, Venous: 0.6 mmol/L (ref 0.5–1.9)

## 2023-12-13 MED ORDER — SODIUM CHLORIDE 0.9 % IV BOLUS
1000.0000 mL | Freq: Once | INTRAVENOUS | Status: AC
  Administered 2023-12-13: 1000 mL via INTRAVENOUS

## 2023-12-13 MED ORDER — ACETAMINOPHEN 10 MG/ML IV SOLN
1000.0000 mg | Freq: Four times a day (QID) | INTRAVENOUS | Status: AC
  Administered 2023-12-13 – 2023-12-14 (×4): 1000 mg via INTRAVENOUS
  Filled 2023-12-13 (×5): qty 100

## 2023-12-13 MED ORDER — IOHEXOL 300 MG/ML  SOLN
100.0000 mL | Freq: Once | INTRAMUSCULAR | Status: AC | PRN
  Administered 2023-12-13: 100 mL via INTRAVENOUS

## 2023-12-13 MED ORDER — ONDANSETRON HCL 4 MG/2ML IJ SOLN
4.0000 mg | INTRAMUSCULAR | Status: AC
  Administered 2023-12-13: 4 mg via INTRAVENOUS
  Filled 2023-12-13: qty 2

## 2023-12-13 MED ORDER — SODIUM CHLORIDE 0.9 % IV SOLN: INTRAVENOUS | Status: DC

## 2023-12-13 MED ORDER — LABETALOL HCL 5 MG/ML IV SOLN: 10.0000 mg | INTRAVENOUS | Status: DC | PRN

## 2023-12-13 MED ORDER — PANTOPRAZOLE SODIUM 40 MG IV SOLR
40.0000 mg | INTRAVENOUS | Status: DC
  Administered 2023-12-13 – 2023-12-17 (×5): 40 mg via INTRAVENOUS
  Filled 2023-12-13 (×5): qty 10

## 2023-12-13 MED ORDER — ACETAMINOPHEN 325 MG PO TABS: 650.0000 mg | ORAL_TABLET | Freq: Four times a day (QID) | ORAL | Status: DC | PRN

## 2023-12-13 MED ORDER — HYDROMORPHONE HCL 1 MG/ML IJ SOLN: 0.5000 mg | INTRAMUSCULAR | Status: DC | PRN

## 2023-12-13 MED ORDER — ENOXAPARIN SODIUM 40 MG/0.4ML IJ SOSY
40.0000 mg | PREFILLED_SYRINGE | INTRAMUSCULAR | Status: DC
  Administered 2023-12-13 – 2023-12-17 (×5): 40 mg via SUBCUTANEOUS
  Filled 2023-12-13 (×5): qty 0.4

## 2023-12-13 MED ORDER — ACETAMINOPHEN 650 MG RE SUPP: 650.0000 mg | Freq: Four times a day (QID) | RECTAL | Status: DC | PRN

## 2023-12-13 MED ORDER — ACETAMINOPHEN 10 MG/ML IV SOLN
1000.0000 mg | Freq: Four times a day (QID) | INTRAVENOUS | Status: DC
  Filled 2023-12-13 (×2): qty 100

## 2023-12-13 MED ORDER — ONDANSETRON HCL 4 MG/2ML IJ SOLN
4.0000 mg | Freq: Four times a day (QID) | INTRAMUSCULAR | Status: DC | PRN
  Administered 2023-12-13: 4 mg via INTRAVENOUS
  Filled 2023-12-13: qty 2

## 2023-12-13 NOTE — Plan of Care (Signed)

## 2023-12-13 NOTE — ED Notes (Signed)
Pt off to imaging when this RN went into room to give meds and bolus.

## 2023-12-13 NOTE — H&P (Signed)
History and Physical    Angela Bass YQM:[id number] DOB: 03-29-1941 DOA: 12/13/2023  PCP: Jerl Mina, MD (Confirm with patient/family/NH records and if not entered, this has to be entered at [facility] point of entry) Patient coming from: Home  I have personally briefly reviewed patient's old medical records in [facility]  Chief Complaint: N/V and belly hurts  HPI: Angela Bass is a 82 y.o. female with medical history significant of HTN, hiatal hernia, morbid obesity, presented with new onset of worsening vomiting abdominal pain.  Patient has been feeling nausea for the last 2 days with significant decreased p.o. intake.  Last night patient started to have vomiting stomach content x 7, none bile nonbloody, accommodated with severe cramping-like abdominal pain became constant this morning and came to ED.  No diarrhea no fever or chills.  No changes of medication recently.  ED Course: Temperature 99.4, pulse 85, blood pressure 120/80, O2 saturation 100% room air.  CT abdomen pelvis showed markedly dilated and fluid-filled stomach with rotation of the stomach along the long axis acute gastric volvulus.  Zofran, IV bolus of 1000 mL and pain medication given in the ED.  NG tube placed in as per general surgery  Review of Systems: As per HPI otherwise 14 point review of systems negative.    Past Medical History:  Diagnosis Date   DDD (degenerative disc disease), lumbar    walks with cane   Uterine cancer (HCC)    55 years ago    Past Surgical History:  Procedure Laterality Date   ABDOMINAL HYSTERECTOMY     BREAST EXCISIONAL BIOPSY Left 2011   Neg     reports that she has never smoked. She has never used smokeless tobacco. She reports that she does not currently use alcohol. She reports that she does not use drugs.  No Known Allergies  Family History  Problem Relation Age of Onset   Breast cancer Mother 24     Prior to Admission medications   Medication Sig Start  Date End Date Taking? Authorizing Provider  ivabradine (CORLANOR) 7.5 MG TABS tablet Take tablets (15mg ) TWO hours prior to your cardiac CT scan. 09/09/22   Debbe Odea, MD  metoprolol tartrate (LOPRESSOR) 100 MG tablet Take tablet (100mg ) TWO hours prior to your cardiac CT scan. 09/09/22   Debbe Odea, MD    Physical Exam: Vitals:   12/13/23 1200 12/13/23 1433 12/13/23 1530 12/13/23 1542  BP:  (!) 158/86 (!) 132/37   Pulse:  84    Resp:      Temp:    98.2 F (36.8 C)  TempSrc:      SpO2:  100%    Weight: 83.9 kg     Height: 4\' 7"  (1.397 m)       Constitutional: NAD, calm, comfortable Vitals:   12/13/23 1200 12/13/23 1433 12/13/23 1530 12/13/23 1542  BP:  (!) 158/86 (!) 132/37   Pulse:  84    Resp:      Temp:    98.2 F (36.8 C)  TempSrc:      SpO2:  100%    Weight: 83.9 kg     Height: 4\' 7"  (1.397 m)      Eyes: PERRL, lids and conjunctivae normal ENMT: Mucous membranes are dry. Posterior pharynx clear of any exudate or lesions.Normal dentition.  Neck: normal, supple, no masses, no thyromegaly Respiratory: clear to auscultation bilaterally, no wheezing, no crackles. Normal respiratory effort. No accessory muscle use.  Cardiovascular: Regular rate  and rhythm, no murmurs / rubs / gallops. No extremity edema. 2+ pedal pulses. No carotid bruits.  Abdomen: Distended, tenderness on epigastric area, no rebound no guarding, no masses palpated. No hepatosplenomegaly. Bowel sounds positive.  Musculoskeletal: no clubbing / cyanosis. No joint deformity upper and lower extremities. Good ROM, no contractures. Normal muscle tone.  Skin: no rashes, lesions, ulcers. No induration Neurologic: CN 2-12 grossly intact. Sensation intact, DTR normal. Strength 5/5 in all 4.  Psychiatric: Normal judgment and insight. Alert and oriented x 3. Normal mood.     Labs on Admission: I have personally reviewed following labs and imaging studies  CBC: Recent Labs  Lab 12/13/23 1236  WBC  11.6*  HGB 13.0  HCT 37.6  MCV 94.7  PLT 264   Basic Metabolic Panel: Recent Labs  Lab 12/13/23 1202  NA 138  K 3.7  CL 96*  CO2 25  GLUCOSE 142*  BUN 10  CREATININE 0.74  CALCIUM 9.2   GFR: Estimated Creatinine Clearance: 47.8 mL/min (by C-G formula based on SCr of 0.74 mg/dL). Liver Function Tests: Recent Labs  Lab 12/13/23 1202  AST 27  ALT 14  ALKPHOS 65  BILITOT 1.4*  PROT 7.3  ALBUMIN 4.0   Recent Labs  Lab 12/13/23 1202  LIPASE 26   No results for input(s): "AMMONIA" in the last 168 hours. Coagulation Profile: No results for input(s): "INR", "PROTIME" in the last 168 hours. Cardiac Enzymes: No results for input(s): "CKTOTAL", "CKMB", "CKMBINDEX", "TROPONINI" in the last 168 hours. BNP (last 3 results) No results for input(s): "PROBNP" in the last 8760 hours. HbA1C: No results for input(s): "HGBA1C" in the last 72 hours. CBG: No results for input(s): "GLUCAP" in the last 168 hours. Lipid Profile: No results for input(s): "CHOL", "HDL", "LDLCALC", "TRIG", "CHOLHDL", "LDLDIRECT" in the last 72 hours. Thyroid Function Tests: No results for input(s): "TSH", "T4TOTAL", "FREET4", "T3FREE", "THYROIDAB" in the last 72 hours. Anemia Panel: No results for input(s): "VITAMINB12", "FOLATE", "FERRITIN", "TIBC", "IRON", "RETICCTPCT" in the last 72 hours. Urine analysis:    Component Value Date/Time   COLORURINE YELLOW (A) 12/13/2023 1236   APPEARANCEUR HAZY (A) 12/13/2023 1236   LABSPEC 1.024 12/13/2023 1236   PHURINE 5.0 12/13/2023 1236   GLUCOSEU NEGATIVE 12/13/2023 1236   HGBUR NEGATIVE 12/13/2023 1236   BILIRUBINUR NEGATIVE 12/13/2023 1236   KETONESUR 20 (A) 12/13/2023 1236   PROTEINUR 100 (A) 12/13/2023 1236   NITRITE NEGATIVE 12/13/2023 1236   LEUKOCYTESUR NEGATIVE 12/13/2023 1236    Radiological Exams on Admission: CT ABDOMEN PELVIS W CONTRAST Result Date: 12/13/2023 CLINICAL DATA:  Bowel obstruction suspected.  Vomiting. EXAM: CT ABDOMEN AND  PELVIS WITH CONTRAST TECHNIQUE: Multidetector CT imaging of the abdomen and pelvis was performed using the standard protocol following bolus administration of intravenous contrast. RADIATION DOSE REDUCTION: This exam was performed according to the departmental dose-optimization program which includes automated exposure control, adjustment of the mA and/or kV according to patient size and/or use of iterative reconstruction technique. CONTRAST:  OMNIPAQUE IOHEXOL 300 MG/ML  SOLN COMPARISON:  CT scan thoracic spine from 09/19/2022. FINDINGS: Lower chest: There are patchy the atelectatic changes in the visualized lung bases. No overt consolidation. No pleural effusion. The heart is normal in size. No pericardial effusion. Hepatobiliary: The liver is normal in size. Non-cirrhotic configuration. No suspicious mass. No intrahepatic or extrahepatic bile duct dilation. No calcified gallstones. Normal gallbladder wall thickness. No pericholecystic inflammatory changes. Pancreas: Unremarkable. No pancreatic ductal dilatation or surrounding inflammatory changes. Spleen: Within  normal limits. No focal lesion. Adrenals/Urinary Tract: Adrenal glands are unremarkable. No suspicious renal mass. No hydronephrosis. No renal or ureteric calculi. Urinary bladder is under distended, precluding optimal assessment. However, no large mass or stones identified. No perivesical fat stranding. Stomach/Bowel: There is markedly dilated and fluid-filled stomach. There is ingested food material and at least 4 medication tablets in the dependent portion of the stomach. Redemonstration of paraesophageal hernia. However, there is rotation of the stomach along its long axis with resultant reversal of the greater and lesser curvatures. The antrum is rotated anteriorly and superiorly while fundus is rotated posteriorly and inferiorly. However, there is no abnormal gastric wall thickening or surrounding fat stranding. The entire stomach is not  imaged on the prior thoracic spine or cardiac CT scan however, configuration of upper portion of the stomach appears grossly similar. No disproportionate dilation of the small or large bowel loops. No evidence of abnormal bowel wall thickening or inflammatory changes. The appendix was not visualized; however there is no acute inflammatory process in the right lower quadrant. There are multiple diverticula mainly in the left hemi colon, without imaging signs of diverticulitis. Vascular/Lymphatic: No ascites or pneumoperitoneum. No abdominal or pelvic lymphadenopathy, by size criteria. No aneurysmal dilation of the major abdominal arteries. There are mild peripheral atherosclerotic vascular calcifications of the aorta and its major branches. Reproductive: The uterus is surgically absent. No large adnexal mass. Other: Periumbilical surgical scar and tiny fat containing hernia noted. The soft tissues and abdominal wall are otherwise unremarkable. Musculoskeletal: No suspicious osseous lesions. There are mild - moderate multilevel degenerative changes in the visualized spine. IMPRESSION: *Markedly dilated and fluid-filled stomach with rotation of stomach along its long axis, as described in detail above, concerning for organo-axial volvulus. Correlate clinically. *Multiple other nonacute observations, as described above. Electronically Signed   By: Jules Schick M.D.   On: 12/13/2023 14:13    EKG: Pending  Assessment/Plan Principal Problem:   Gastric volvulus Active Problems:   Acute gastric volvulus  (please populate well all problems here in Problem List. (For example, if patient is on BP meds at home and you resume or decide to hold them, it is a problem that needs to be her. Same for CAD, COPD, HLD and so on)  Acute gastric volvulus -NGT, continuous suction to decompress -Supportive care including IVF, n.p.o., as needed Zofran and Dilaudid for symptoms -Review of labs showed no AKI, no lactic acidosis  so far clinically no strong evidence of acute gastric ischemia, will repeat lactic acid and other labs of CBC and BMP tomorrow -As per general surgery's recommendation, also consulted GI Dr. Servando Snare. -GI prophylaxis  HTN -Hold off home BP meds, start as needed labetalol  Morbid obesity -BMI= 43, recommend calorie control  DVT prophylaxis: Lovenox Code Status: Full code Family Communication: Daughter and granddaughter at bedside Disposition Plan: Stick with gastric volvulus, requiring NGT and inpatient GI and surgical consultation, expect more than 2 midnight hospital stay Consults called: General Surgeon Dr. Everlene Farrier and GI Dr. Servando Snare Admission status: Telemetry admission   Emeline General MD [facility] Pager [phone]  12/13/2023, 3:46 PM

## 2023-12-13 NOTE — ED Notes (Signed)
Pt called to bedside for Ice chips, This RN stated to let the EDP assess her first and would give ice after assessment.

## 2023-12-13 NOTE — ED Provider Notes (Signed)
[facility] Provider Note    Event Date/Time   First MD Initiated Contact with Patient 12/13/23 1258     (approximate)   History   Emesis and oliguria   HPI  Angela Bass is a 82 y.o. female reports no major past medical history other than high cholesterol  Yesterday she began exam is submitting nausea and vomiting.  Some very mild pain but when she knows what ever she tries to eat anything shortly thereafter she will get nauseated and vomit.  She has not had any fever or chills but felt like she might of had a low-grade temperature of 99 and reports when EMS arrived her temperature was about 100 Fahrenheit  No shortness of breath no chest pain.  Relates that she has had decreased urine output no appetite and has not had anything solid to eat.  She spent most the day in bed today with exception to episodes of vomiting where she was having slightly dark emesis.  Denies any previous abdominal surgeries.  No pain or burning with urination some discomfort along the right back     Physical Exam   Triage Vital Signs: ED Triage Vitals  Encounter Vitals Group     BP 12/13/23 1158 125/81     Systolic BP Percentile --      Diastolic BP Percentile --      Pulse Rate 12/13/23 1158 85     Resp 12/13/23 1158 16     Temp 12/13/23 1158 99.4 F (37.4 C)     Temp Source 12/13/23 1158 Oral     SpO2 12/13/23 1158 94 %     Weight 12/13/23 1200 185 lb (83.9 kg)     Height 12/13/23 1200 4\' 7"  (1.397 m)     Head Circumference --      Peak Flow --      Pain Score 12/13/23 1156 0     Pain Loc --      Pain Education --      Exclude from Growth Chart --     Most recent vital signs: Vitals:   12/13/23 1158 12/13/23 1433  BP: 125/81 (!) 158/86  Pulse: 85 84  Resp: 16   Temp: 99.4 F (37.4 C)   SpO2: 94% 100%     General: Awake, no distress.   membranes slightly dry.  Emesis bag in hand no active emesis CV:  Good peripheral perfusion.  Normal  tones Resp:  Normal effort.  Clear bilateral with normal work of breathing Abd:  No distention.  Moderate to fairly severe tenderness to palpation in the right upper quadrant epigastrium .  No rebound or guarding.  Remaining quadrants nontender. Other:  Warm well-perfused extremities.  She is fully awake and alert family also at the bedside all very pleasant   ED Results / Procedures / Treatments   Labs (all labs ordered are listed, but only abnormal results are displayed) Labs Reviewed  COMPREHENSIVE METABOLIC PANEL - Abnormal; Notable for the following components:      Result Value   Chloride 96 (*)    Glucose, Bld 142 (*)    Total Bilirubin 1.4 (*)    Anion gap 17 (*)    All other components within normal limits  URINALYSIS, ROUTINE W REFLEX MICROSCOPIC - Abnormal; Notable for the following components:   Color, Urine YELLOW (*)    APPearance HAZY (*)    Ketones, ur 20 (*)    Protein, ur 100 (*)  Bacteria, UA RARE (*)    All other components within normal limits  CBC - Abnormal; Notable for the following components:   WBC 11.6 (*)    All other components within normal limits  URINE CULTURE  LIPASE, BLOOD  LACTIC ACID, PLASMA     EKG  ED interpreted by me at 1230 heart rate 90 QRS 100 QTc 470   RADIOLOGY  Awaiting interpretation by radiologist but on my review of the patient's abdominal CT a large hiatal hernia is present with distended stomach   CT ABDOMEN PELVIS W CONTRAST Result Date: 12/13/2023 CLINICAL DATA:  Bowel obstruction suspected.  Vomiting. EXAM: CT ABDOMEN AND PELVIS WITH CONTRAST TECHNIQUE: Multidetector CT imaging of the abdomen and pelvis was performed using the standard protocol following bolus administration of intravenous contrast. RADIATION DOSE REDUCTION: This exam was performed according to the departmental dose-optimization program which includes automated exposure control, adjustment of the mA and/or kV according to patient size and/or use of  iterative reconstruction technique. CONTRAST:  OMNIPAQUE IOHEXOL 300 MG/ML  SOLN COMPARISON:  CT scan thoracic spine from 09/19/2022. FINDINGS: Lower chest: There are patchy the atelectatic changes in the visualized lung bases. No overt consolidation. No pleural effusion. The heart is normal in size. No pericardial effusion. Hepatobiliary: The liver is normal in size. Non-cirrhotic configuration. No suspicious mass. No intrahepatic or extrahepatic bile duct dilation. No calcified gallstones. Normal gallbladder wall thickness. No pericholecystic inflammatory changes. Pancreas: Unremarkable. No pancreatic ductal dilatation or surrounding inflammatory changes. Spleen: Within normal limits. No focal lesion. Adrenals/Urinary Tract: Adrenal glands are unremarkable. No suspicious renal mass. No hydronephrosis. No renal or ureteric calculi. Urinary bladder is under distended, precluding optimal assessment. However, no large mass or stones identified. No perivesical fat stranding. Stomach/Bowel: There is markedly dilated and fluid-filled stomach. There is ingested food material and at least 4 medication tablets in the dependent portion of the stomach. Redemonstration of paraesophageal hernia. However, there is rotation of the stomach along its long axis with resultant reversal of the greater and lesser curvatures. The antrum is rotated anteriorly and superiorly while fundus is rotated posteriorly and inferiorly. However, there is no abnormal gastric wall thickening or surrounding fat stranding. The entire stomach is not imaged on the prior thoracic spine or cardiac CT scan however, configuration of upper portion of the stomach appears grossly similar. No disproportionate dilation of the small or large bowel loops. No evidence of abnormal bowel wall thickening or inflammatory changes. The appendix was not visualized; however there is no acute inflammatory process in the right lower quadrant. There are multiple  diverticula mainly in the left hemi colon, without imaging signs of diverticulitis. Vascular/Lymphatic: No ascites or pneumoperitoneum. No abdominal or pelvic lymphadenopathy, by size criteria. No aneurysmal dilation of the major abdominal arteries. There are mild peripheral atherosclerotic vascular calcifications of the aorta and its major branches. Reproductive: The uterus is surgically absent. No large adnexal mass. Other: Periumbilical surgical scar and tiny fat containing hernia noted. The soft tissues and abdominal wall are otherwise unremarkable. Musculoskeletal: No suspicious osseous lesions. There are mild - moderate multilevel degenerative changes in the visualized spine. IMPRESSION: *Markedly dilated and fluid-filled stomach with rotation of stomach along its long axis, as described in detail above, concerning for organo-axial volvulus. Correlate clinically. *Multiple other nonacute observations, as described above. Electronically Signed   By: Jules Schick M.D.   On: 12/13/2023 14:13      PROCEDURES:  Critical Care performed: No  Procedures   MEDICATIONS ORDERED  IN ED: Medications  acetaminophen (OFIRMEV) IV 1,000 mg (0 mg Intravenous Stopped 12/13/23 1416)  sodium chloride 0.9 % bolus 1,000 mL (has no administration in time range)  sodium chloride 0.9 % bolus 1,000 mL (1,000 mLs Intravenous New Bag/Given 12/13/23 1358)  ondansetron (ZOFRAN) injection 4 mg (4 mg Intravenous Given 12/13/23 1401)  iohexol (OMNIPAQUE) 300 MG/ML solution 100 mL (100 mLs Intravenous Contrast Given 12/13/23 1329)  ondansetron (ZOFRAN) injection 4 mg (4 mg Intravenous Given 12/13/23 1443)     IMPRESSION / MDM / ASSESSMENT AND PLAN / ED COURSE  I reviewed the triage vital signs and the nursing notes.                              Differential diagnosis includes, but is not limited to, possible obstruction, cholelithiasis cholecystitis, diverticulitis, pyelonephritis, or other causation's.  Appears to  have an acute gastrointestinal illness without associated cardiopulmonary symptoms.  She does relate however a slight discomfort or a strange feeling in her chest that occurred right after the vomiting began around the time the vomiting began.  Patient's presentation is most consistent with acute complicated illness / injury requiring diagnostic workup.   ----------------------------------------- 3:14 PM on 12/13/2023 ----------------------------------------- Patient accepted in admission by Dr. Chipper Herb, Dr. Everlene Farrier also has evaluated, and is recommending nasogastric decompression.  Nursing to provide decompression and then obtain x-ray for verification thereafter.  Patient understanding agreeable with plan for admission and surgical consultation     FINAL CLINICAL IMPRESSION(S) / ED DIAGNOSES   Final diagnoses:  Gastrointestinal obstruction (HCC)     Rx / DC Orders   ED Discharge Orders     None        Note:  This document was prepared using Dragon voice recognition software and may include unintentional dictation errors.   Sharyn Creamer, MD 12/13/23 [phone]

## 2023-12-13 NOTE — ED Triage Notes (Signed)
Pt to ED from home [facility] for oliguria X 2 days  and vomiting (brown) since last night (over 10 times since last night).   Pt states last urination was this AM 0600 but very scant.   Per EMS, pt has UTI smell.  EMS VS: negative orthostatic VS, HR 80s, SPO2 94%, 140/68, CBG 178, 22# to R hand, 4mg  zofran given  Family concerned for dehydration. Pt states cannot keep water down.  Pt denies PMH.

## 2023-12-13 NOTE — Progress Notes (Signed)
82 year old with gastric outlet obstruction related to large paraesophageal hernia.  Although she does have some degree of volvulus this seems to be chronic and there is no evidence of ischemia within the stomach that will mandate emergent surgical intervention. Recommend admission for resuscitation, NG decompression.  I do think that we will probably have to perform repair int he acute setting but certainly not emergently.  Full consult to follow

## 2023-12-13 NOTE — ED Notes (Signed)
Urine, COVID swab and CBC send down with Pt labels.

## 2023-12-14 ENCOUNTER — Encounter
Admission: EM | Disposition: A | Discharge status: Home / Self Care | Payer: Self-pay | Source: Home / Self Care | Attending: Internal Medicine

## 2023-12-14 ENCOUNTER — Inpatient Hospital Stay: Payer: Medicare HMO | Admitting: Anesthesiology

## 2023-12-14 ENCOUNTER — Encounter: Payer: Self-pay | Admitting: Gastroenterology

## 2023-12-14 ENCOUNTER — Inpatient Hospital Stay: Payer: Medicare HMO

## 2023-12-14 DIAGNOSIS — R112 Nausea with vomiting, unspecified: Secondary | ICD-10-CM | POA: Diagnosis not present

## 2023-12-14 DIAGNOSIS — K3189 Other diseases of stomach and duodenum: Secondary | ICD-10-CM | POA: Diagnosis not present

## 2023-12-14 DIAGNOSIS — E876 Hypokalemia: Secondary | ICD-10-CM | POA: Insufficient documentation

## 2023-12-14 DIAGNOSIS — K449 Diaphragmatic hernia without obstruction or gangrene: Secondary | ICD-10-CM

## 2023-12-14 DIAGNOSIS — K311 Adult hypertrophic pyloric stenosis: Secondary | ICD-10-CM | POA: Diagnosis not present

## 2023-12-14 HISTORY — PX: ESOPHAGOGASTRODUODENOSCOPY: SHX5428

## 2023-12-14 LAB — BASIC METABOLIC PANEL
Anion gap: 10 (ref 5–15)
BUN: 11 mg/dL (ref 8–23)
CO2: 28 mmol/L (ref 22–32)
Calcium: 8.3 mg/dL — ABNORMAL LOW (ref 8.9–10.3)
Chloride: 100 mmol/L (ref 98–111)
Creatinine, Ser: 0.82 mg/dL (ref 0.44–1.00)
GFR, Estimated: >60 mL/min (ref >60)
Glucose, Bld: 106 mg/dL — ABNORMAL HIGH (ref 70–99)
Potassium: 3.2 mmol/L — ABNORMAL LOW (ref 3.5–5.1)
Sodium: 138 mmol/L (ref 135–145)

## 2023-12-14 LAB — LACTIC ACID, PLASMA: Lactic Acid, Venous: 1 mmol/L (ref 0.5–1.9)

## 2023-12-14 LAB — CBC
HCT: 34.1 % — ABNORMAL LOW (ref 36.0–46.0)
Hemoglobin: 11.4 g/dL — ABNORMAL LOW (ref 12.0–15.0)
MCH: 32.5 pg (ref 26.0–34.0)
MCHC: 33.4 g/dL (ref 30.0–36.0)
MCV: 97.2 fL (ref 80.0–100.0)
Platelets: 225 10*3/uL (ref 150–400)
RBC: 3.51 MIL/uL — ABNORMAL LOW (ref 3.87–5.11)
RDW: 13.8 % (ref 11.5–15.5)
WBC: 9.2 10*3/uL (ref 4.0–10.5)
nRBC: 0 % (ref 0.0–0.2)

## 2023-12-14 LAB — ABO/RH: ABO/RH(D): O POS

## 2023-12-14 LAB — TYPE AND SCREEN
ABO/RH(D): O POS
Antibody Screen: NEGATIVE

## 2023-12-14 SURGERY — ESOPHAGOGASTRODUODENOSCOPY (EGD) WITH PROPOFOL
Anesthesia: General

## 2023-12-14 SURGERY — EGD (ESOPHAGOGASTRODUODENOSCOPY)
Anesthesia: General

## 2023-12-14 MED ORDER — PROPOFOL 500 MG/50ML IV EMUL
INTRAVENOUS | Status: DC | PRN
  Administered 2023-12-14: 120 ug/kg/min via INTRAVENOUS

## 2023-12-14 MED ORDER — PROPOFOL 10 MG/ML IV BOLUS
INTRAVENOUS | Status: DC | PRN
  Administered 2023-12-14: 100 mg via INTRAVENOUS

## 2023-12-14 MED ORDER — ONDANSETRON HCL 4 MG/2ML IJ SOLN
INTRAMUSCULAR | Status: DC | PRN
  Administered 2023-12-14: 4 mg via INTRAVENOUS

## 2023-12-14 MED ORDER — POTASSIUM CHLORIDE 10 MEQ/100ML IV SOLN
10.0000 meq | Freq: Once | INTRAVENOUS | Status: AC
  Administered 2023-12-14: 10 meq via INTRAVENOUS
  Filled 2023-12-14: qty 100

## 2023-12-14 MED ORDER — LIDOCAINE HCL (CARDIAC) PF 100 MG/5ML IV SOSY
PREFILLED_SYRINGE | INTRAVENOUS | Status: DC | PRN
  Administered 2023-12-14: 80 mg via INTRAVENOUS

## 2023-12-14 MED ORDER — CHLORHEXIDINE GLUCONATE CLOTH 2 % EX PADS
6.0000 | MEDICATED_PAD | Freq: Every day | CUTANEOUS | Status: DC
Start: 2023-12-14 — End: 2023-12-16
  Administered 2023-12-15: 6 via TOPICAL

## 2023-12-14 MED ORDER — ONDANSETRON HCL 4 MG/2ML IJ SOLN
INTRAMUSCULAR | Status: AC
  Filled 2023-12-14: qty 2

## 2023-12-14 MED ORDER — SUCCINYLCHOLINE CHLORIDE 200 MG/10ML IV SOSY
PREFILLED_SYRINGE | INTRAVENOUS | Status: AC
  Filled 2023-12-14: qty 10

## 2023-12-14 MED ORDER — PROPOFOL 1000 MG/100ML IV EMUL
INTRAVENOUS | Status: AC
  Filled 2023-12-14: qty 100

## 2023-12-14 MED ORDER — DEXAMETHASONE SODIUM PHOSPHATE 10 MG/ML IJ SOLN
INTRAMUSCULAR | Status: AC
Start: 2023-12-14 — End: ?
  Filled 2023-12-14: qty 1

## 2023-12-14 MED ORDER — SODIUM CHLORIDE 0.9 % IV SOLN: INTRAVENOUS | Status: DC

## 2023-12-14 MED ORDER — DEXAMETHASONE SODIUM PHOSPHATE 10 MG/ML IJ SOLN
INTRAMUSCULAR | Status: DC | PRN
  Administered 2023-12-14: 5 mg via INTRAVENOUS

## 2023-12-14 MED ORDER — LIDOCAINE HCL (PF) 2 % IJ SOLN
INTRAMUSCULAR | Status: AC
  Filled 2023-12-14: qty 5

## 2023-12-14 MED ORDER — CEFAZOLIN SODIUM-DEXTROSE 2-4 GM/100ML-% IV SOLN
2.0000 g | Freq: Once | INTRAVENOUS | Status: AC
  Administered 2023-12-15: 2 g via INTRAVENOUS
  Filled 2023-12-14: qty 100

## 2023-12-14 MED ORDER — SUCCINYLCHOLINE CHLORIDE 200 MG/10ML IV SOSY
PREFILLED_SYRINGE | INTRAVENOUS | Status: DC | PRN
  Administered 2023-12-14: 100 mg via INTRAVENOUS

## 2023-12-14 MED ORDER — POTASSIUM CHLORIDE 2 MEQ/ML IV SOLN
INTRAVENOUS | Status: AC
  Filled 2023-12-14 (×3): qty 1000

## 2023-12-14 MED ORDER — KCL-LACTATED RINGERS 20 MEQ/L IV SOLN
INTRAVENOUS | Status: DC
  Filled 2023-12-14: qty 1000

## 2023-12-14 NOTE — Progress Notes (Signed)
  Progress Note   Patient: Angela Bass WGN:[id number] DOB: [birth year] DOA: 12/13/2023     1 DOS: the patient was seen and examined on 12/14/2023   Brief hospital course: KEKELI ORTMAN is a 82 y.o. female with medical history significant of HTN, hiatal hernia, morbid obesity, presented with new onset of worsening vomiting abdominal pain. CT abdomen pelvis showed markedly dilated and fluid-filled stomach with rotation of the stomach along the long axis acute gastric volvulus.    Principal Problem:   Gastric volvulus Active Problems:   Acute gastric volvulus   Partial gastric outlet obstruction   Paraesophageal hernia   Assessment and Plan: Gastric outlet obstruction. Giant hiatal hernia. Gastric volvulus. Patient has been seen by general surgery and GI.  EGD is performed today.  General surgery plan to take patient to surgery on Friday. Continue IV fluids and NG suction.  Essential hypertension. Continue and blood pressure medicines.  Morbid obesity. Continue to follow.  Hypokalemia. Supplemented through IV.  Recheck levels tomorrow.    Subjective:  Patient doing better today with NG suction.  No significant abdominal pain or nausea vomiting.  Physical Exam: Vitals:   12/13/23 1723 12/13/23 2042 12/14/23 0400 12/14/23 0745  BP: (!) 123/94 (!) 134/52 125/61 (!) 120/49  Pulse: 96 80 78 72  Resp: 16 16 20 16   Temp: 98.1 F (36.7 C) 98.1 F (36.7 C) 97.9 F (36.6 C) 98.6 F (37 C)  TempSrc: Oral Oral Oral Oral  SpO2: 90% 98% 98% 96%  Weight:      Height:       General exam: Appears calm and comfortable  Respiratory system: Clear to auscultation. Respiratory effort normal. Cardiovascular system: S1 & S2 heard, RRR. No JVD, murmurs, rubs, gallops or clicks. No pedal edema. Gastrointestinal system: Abdomen is nondistended, soft and nontender. No organomegaly or masses felt. Normal bowel sounds heard. Central nervous system: Alert and oriented. No focal  neurological deficits. Extremities: Symmetric 5 x 5 power. Skin: No rashes, lesions or ulcers Psychiatry: Judgement and insight appear normal. Mood & affect appropriate.    Data Reviewed:  CT scan and the lab results reviewed.  Family Communication: Daughter updated at the bedside.  Disposition: Status is: Inpatient Remains inpatient appropriate because: Severity of disease, IV treatment, inpatient procedures.     Time spent: 35 minutes  Author: Marrion Coy, MD 12/14/2023 12:05 PM  For on call review [email or web address].

## 2023-12-14 NOTE — Consult Note (Signed)
Midge Minium, MD [facility]  [address], [address] [place], [place] [address] Phone: [phone] Fax : [phone]  Consultation  Referring Provider:     Dr. Chipper Herb Primary Care Physician:  Jerl Mina, MD Primary Gastroenterologist: [facility]         Reason for Consultation:     Vomiting with gastric volvulus  Date of Admission:  12/13/2023 Date of Consultation:  12/14/2023         HPI:   Angela Bass is a 82 y.o. female who was admitted yesterday with nausea and vomiting.  The patient reported that she had pain anytime she ate and would get nauseated and vomit.  The patient states that the symptoms have been going on for 2 days prior.  She was vomiting up gastric contents.  The patient had a CT scan that showed:  IMPRESSION: *Markedly dilated and fluid-filled stomach with rotation of stomach along its long axis, as described in detail above, concerning for organo-axial volvulus. Correlate clinically. *Multiple other nonacute observations, as described above.  The patient had an NG tube placed for decompression and surgery had mentioned that there was no evidence of ischemia within the stomach wall and is planning to bring him to surgery tomorrow for repair.  I was consulted yesterday and spoke to the surgeon about doing an upper endoscopy preoperatively to make sure there are no strictures or narrowing of the esophagus nor other other things found to complicate the surgery.  Past Medical History:  Diagnosis Date   DDD (degenerative disc disease), lumbar    walks with cane   Uterine cancer (HCC)    55 years ago    Past Surgical History:  Procedure Laterality Date   ABDOMINAL HYSTERECTOMY     BREAST EXCISIONAL BIOPSY Left 2011   Neg    Prior to Admission medications   Medication Sig Start Date End Date Taking? Authorizing Provider  doxycycline (VIBRA-TABS) 100 MG tablet Take 100 mg by mouth daily. 12/01/23 02/29/24 Yes [provider]   simvastatin (ZOCOR) 20 MG tablet Take 1 tablet by mouth daily. 12/01/23  Yes [provider]  furosemide (LASIX) 20 MG tablet Take 20 mg by mouth daily. Patient not taking: Reported on 12/13/2023    [provider]  ivabradine (CORLANOR) 7.5 MG TABS tablet Take tablets (15mg ) TWO hours prior to your cardiac CT scan. Patient not taking: Reported on 12/13/2023 09/09/22   Debbe Odea, MD  metoprolol tartrate (LOPRESSOR) 100 MG tablet Take tablet (100mg ) TWO hours prior to your cardiac CT scan. Patient not taking: Reported on 12/13/2023 09/09/22   Debbe Odea, MD    Family History  Problem Relation Age of Onset   Breast cancer Mother 16     Social History   Tobacco Use   Smoking status: Never   Smokeless tobacco: Never  Vaping Use   Vaping status: Never Used  Substance Use Topics   Alcohol use: Not Currently   Drug use: Never    Allergies as of 12/13/2023   (No Known Allergies)    Review of Systems:    All systems reviewed and negative except where noted in HPI.   Physical Exam:  Vital signs in last 24 hours: Temp:  [97.9 F (36.6 C)-99.4 F (37.4 C)] 98.6 F (37 C) (12/26 0745) Pulse Rate:  [72-96] 72 (12/26 0745) Resp:  [16-20] 16 (12/26 0745) BP: (120-158)/(37-94) 120/49 (12/26 0745) SpO2:  [90 %-100 %] 96 % (12/26 0745) Weight:  [  83.9 kg] 83.9 kg (12/25 1200) Last BM Date : 12/11/23 General:   Pleasant, cooperative in NAD Head:  Normocephalic and atraumatic.  NG tube in place Eyes:   No icterus.   Conjunctiva pink. PERRLA. Ears:  Normal auditory acuity. Neck:  Supple; no masses or thyroidomegaly Lungs: Respirations even and unlabored. Lungs clear to auscultation bilaterally.   No wheezes, crackles, or rhonchi.  Heart:  Regular rate and rhythm;  Without murmur, clicks, rubs or gallops Abdomen:  Soft, nondistended, nontender. Normal bowel sounds. No appreciable masses or hepatomegaly.  No rebound or guarding.  Rectal:  Not  performed. Msk:  Symmetrical without gross deformities.    Extremities:  Without edema, cyanosis or clubbing. Neurologic:  Alert and oriented x3;  grossly normal neurologically. Skin:  Intact without significant lesions or rashes. Cervical Nodes:  No significant cervical adenopathy. Psych:  Alert and cooperative. Normal affect.  LAB RESULTS: Recent Labs    12/13/23 1236 12/14/23 0544  WBC 11.6* 9.2  HGB 13.0 11.4*  HCT 37.6 34.1*  PLT 264 225   BMET Recent Labs    12/13/23 1202 12/14/23 0544  NA 138 138  K 3.7 3.2*  CL 96* 100  CO2 25 28  GLUCOSE 142* 106*  BUN 10 11  CREATININE 0.74 0.82  CALCIUM 9.2 8.3*   LFT Recent Labs    12/13/23 1202  PROT 7.3  ALBUMIN 4.0  AST 27  ALT 14  ALKPHOS 65  BILITOT 1.4*   PT/INR No results for input(s): "LABPROT", "INR" in the last 72 hours.  STUDIES: DG ABD ACUTE 2+V W 1V CHEST Result Date: 12/14/2023 CLINICAL DATA:  [id number] with gastric outlet obstruction. EXAM: DG ABDOMEN ACUTE WITH 1 VIEW CHEST COMPARISON:  CT abdomen pelvis 12/13/2023 FINDINGS: There is no evidence of dilated bowel loops or free intraperitoneal air. No urinary calculi or other significant radiographic abnormality is seen. The CT demonstrated a large hiatal hernia eccentric to the right containing the gastric antrum and gastroduodenal junction with the fundus fluid dilated located in its normal position below the diaphragm. NGT has been inserted extends below the diaphragm and is coiled in the gastric fundus. Clustered on the adjusted densities in the ascending colon are again noted possibly undigested medication with additional similar clustered densities in the fundus of the stomach. There are multiple pelvic phleboliths. There is advanced hip DJD on the left-greater-than-right, with left acetabular protrusio. Heart size and mediastinal contours are within normal limits except as above. Both lungs are clear. IMPRESSION: 1. No evidence of bowel obstruction or  free air. 2. NGT coiled in the gastric fundus. 3. Large hiatal hernia eccentric to the right containing the gastric antrum and gastroduodenal junction on CT, with the fundus fluid-dilated located in its normal position below the diaphragm. 4. Clustered densities in the ascending colon and fundus of the stomach possibly undigested medication. 5. No acute chest findings. Electronically Signed   By: Almira Bar M.D.   On: 12/14/2023 07:42   DG Abdomen 1 View Result Date: 12/13/2023 CLINICAL DATA:  NG placement. EXAM: ABDOMEN - 1 VIEW COMPARISON:  CT abdomen pelvis dated 12/13/2023. FINDINGS: Enteric tube with tip in the left upper abdomen over the gastric air. Bibasilar pulmonary opacities, left greater than right. IMPRESSION: Enteric tube with tip in the stomach. Electronically Signed   By: Elgie Collard M.D.   On: 12/13/2023 16:14   CT ABDOMEN PELVIS W CONTRAST Result Date: 12/13/2023 CLINICAL DATA:  Bowel obstruction suspected.  Vomiting. EXAM: CT ABDOMEN  AND PELVIS WITH CONTRAST TECHNIQUE: Multidetector CT imaging of the abdomen and pelvis was performed using the standard protocol following bolus administration of intravenous contrast. RADIATION DOSE REDUCTION: This exam was performed according to the departmental dose-optimization program which includes automated exposure control, adjustment of the mA and/or kV according to patient size and/or use of iterative reconstruction technique. CONTRAST:  OMNIPAQUE IOHEXOL 300 MG/ML  SOLN COMPARISON:  CT scan thoracic spine from 09/19/2022. FINDINGS: Lower chest: There are patchy the atelectatic changes in the visualized lung bases. No overt consolidation. No pleural effusion. The heart is normal in size. No pericardial effusion. Hepatobiliary: The liver is normal in size. Non-cirrhotic configuration. No suspicious mass. No intrahepatic or extrahepatic bile duct dilation. No calcified gallstones. Normal gallbladder wall thickness. No pericholecystic  inflammatory changes. Pancreas: Unremarkable. No pancreatic ductal dilatation or surrounding inflammatory changes. Spleen: Within normal limits. No focal lesion. Adrenals/Urinary Tract: Adrenal glands are unremarkable. No suspicious renal mass. No hydronephrosis. No renal or ureteric calculi. Urinary bladder is under distended, precluding optimal assessment. However, no large mass or stones identified. No perivesical fat stranding. Stomach/Bowel: There is markedly dilated and fluid-filled stomach. There is ingested food material and at least 4 medication tablets in the dependent portion of the stomach. Redemonstration of paraesophageal hernia. However, there is rotation of the stomach along its long axis with resultant reversal of the greater and lesser curvatures. The antrum is rotated anteriorly and superiorly while fundus is rotated posteriorly and inferiorly. However, there is no abnormal gastric wall thickening or surrounding fat stranding. The entire stomach is not imaged on the prior thoracic spine or cardiac CT scan however, configuration of upper portion of the stomach appears grossly similar. No disproportionate dilation of the small or large bowel loops. No evidence of abnormal bowel wall thickening or inflammatory changes. The appendix was not visualized; however there is no acute inflammatory process in the right lower quadrant. There are multiple diverticula mainly in the left hemi colon, without imaging signs of diverticulitis. Vascular/Lymphatic: No ascites or pneumoperitoneum. No abdominal or pelvic lymphadenopathy, by size criteria. No aneurysmal dilation of the major abdominal arteries. There are mild peripheral atherosclerotic vascular calcifications of the aorta and its major branches. Reproductive: The uterus is surgically absent. No large adnexal mass. Other: Periumbilical surgical scar and tiny fat containing hernia noted. The soft tissues and abdominal wall are otherwise unremarkable.  Musculoskeletal: No suspicious osseous lesions. There are mild - moderate multilevel degenerative changes in the visualized spine. IMPRESSION: *Markedly dilated and fluid-filled stomach with rotation of stomach along its long axis, as described in detail above, concerning for organo-axial volvulus. Correlate clinically. *Multiple other nonacute observations, as described above. Electronically Signed   By: Jules Schick M.D.   On: 12/13/2023 14:13      Impression / Plan:   Assessment: Principal Problem:   Gastric volvulus Active Problems:   Acute gastric volvulus   Partial gastric outlet obstruction   Paraesophageal hernia   Angela Bass is a 82 y.o. y/o female with who came in with nausea and vomiting and a large paraesophageal hernia.  The patient is going for surgery tomorrow to have this repaired.  I have been consulted to take the patient for an upper endoscopy to rule out any strictures or narrowing or any other issues that may complicate the surgery.  Plan:  The patient will be set up for an EGD for today.  The patient and the family have been explained the plan.  The patient has been  told the risks and benefits of the procedure and agrees with having done.  Thank you for involving me in the care of this patient.      LOS: 1 day   Midge Minium, MD, [facility] 12/14/2023, 11:29 AM,  Pager [phone] 7am-5pm  Check AMION for 5pm -7am coverage and on weekends   Note: This dictation was prepared with Dragon dictation along with smaller phrase technology. Any transcriptional errors that result from this process are unintentional.

## 2023-12-14 NOTE — Consult Note (Signed)
Patient ID: Angela Bass, female   DOB: 05/07/1941, 82 y.o.   MRN: [id number]  [name] is a 82 y.o. female seen in consultation.  She presented yesterday with an acute onset of abdominal pain nausea and vomiting.  She reports that she has been having symptoms for the last 2 days.  Pain was moderate sharp and now subsided.  She had multiple episode of emesis.  The pain that she experienced was cramping and there was no specific alleviating or aggravating factors.  She did have CT scan that have personally reviewed showing a giant paraesophageal hernia with most of the stomach within the mediastinum.  There is evidence of a gastric outlet obstruction.  The stomach does not seem to be necrotic or ischemic Labs showing mildly increased in the white count of 11.6 with a normal hemoglobin, BMP is normal She does have a high BMI, prior history abdominal hysterectomy   HPI  Past Medical History:  Diagnosis Date   DDD (degenerative disc disease), lumbar    walks with cane   Uterine cancer (HCC)    55 years ago    Past Surgical History:  Procedure Laterality Date   ABDOMINAL HYSTERECTOMY     BREAST EXCISIONAL BIOPSY Left 2011   Neg    Family History  Problem Relation Age of Onset   Breast cancer Mother 76    Social History Social History   Tobacco Use   Smoking status: Never   Smokeless tobacco: Never  Vaping Use   Vaping status: Never Used  Substance Use Topics   Alcohol use: Not Currently   Drug use: Never    No Known Allergies  Current Facility-Administered Medications  Medication Dose Route Frequency Provider Last Rate Last Admin   0.9 %  sodium chloride infusion   Intravenous Continuous Emeline General, MD 125 mL/hr at 12/14/23 0451 Infusion Verify at 12/14/23 0451   acetaminophen (TYLENOL) tablet 650 mg  650 mg Per Tube Q6H PRN Emeline General, MD       Or   acetaminophen (TYLENOL) suppository 650 mg  650 mg Rectal Q6H PRN Mikey College T, MD       enoxaparin  (LOVENOX) injection 40 mg  40 mg Subcutaneous Q24H Mikey College T, MD   40 mg at 12/13/23 2034   HYDROmorphone (DILAUDID) injection 0.5 mg  0.5 mg Intravenous Q4H PRN Mikey College T, MD       labetalol (NORMODYNE) injection 10 mg  10 mg Intravenous Q4H PRN Mikey College T, MD       ondansetron [facility]) injection 4 mg  4 mg Intravenous Q6H PRN Mikey College T, MD   4 mg at 12/13/23 2045   pantoprazole (PROTONIX) injection 40 mg  40 mg Intravenous Q24H Mikey College T, MD   40 mg at 12/13/23 1613     Review of Systems Full ROS  was asked and was negative except for the information on the HPI  Physical Exam Blood pressure 125/61, pulse 78, temperature 97.9 F (36.6 C), temperature source Oral, resp. rate 20, height 4\' 7"  (1.397 m), weight 83.9 kg, SpO2 98%. CONSTITUTIONAL: chronically ill. EYES: Pupils are equal, round, , Sclera are non-icteric. EARS, NOSE, MOUTH AND THROAT The oral mucosa is pink and moist. Hearing is intact to voice. LYMPH NODES:  Lymph nodes in the neck are normal. RESPIRATORY:  Lungs are clear. There is normal respiratory effort, with equal breath sounds bilaterally, and without pathologic use of accessory muscles. CARDIOVASCULAR: Heart  is regular without murmurs, gallops, or rubs. GI: The abdomen is  soft, mildly distended, nontender, . There are no palpable masses. There is no hepatosplenomegaly. No peritonitis . GU: Rectal deferred.   MUSCULOSKELETAL: Normal muscle strength and tone. No cyanosis or edema.   SKIN: Turgor is good and there are no pathologic skin lesions or ulcers. NEUROLOGIC: Motor and sensation is grossly normal. Cranial nerves are grossly intact. PSYCH:  Oriented to person, place and time. Affect is normal.  Data Reviewed  I have personally reviewed the patient's imaging, laboratory findings and medical records.    Assessment/Plan 82 year old with changes consistent with gastric outlet obstruction related to giant paraesophageal hernia.  I had an  extensive discussion with the patient regarding her disease process. I do think that given her obstructive symptoms we should perform a hiatal hernia in a semiurgent fashion while she is still in the hospital.  Discussed with Dr.Wohl from GI about possibility of doing an EGD preoperatively to make sure there is no strictures in the esophagus. Do think that we can still perform this robotically.  Procedure discussed with her in detail.  Risk, benefits and possible complications including but not limited to: Bleeding, infection esophageal injuries, bowel injuries, chronic bloating.  Recurrences and chronic pain.  She understands and wishes to proceed.  She does have a BMI and the acuteness of her illness makes an increased risk for perioperative morbidity and recurrences.  On the other hand I do not think that we have any other good alternatives.  She understands this fully.   Sterling Big, MD FACS General Surgeon 12/14/2023, 7:28 AM

## 2023-12-14 NOTE — Plan of Care (Signed)
  Problem: Education: Goal: Knowledge of General Education information will improve Description: Including pain rating scale, medication(s)/side effects and non-pharmacologic comfort measures Outcome: Progressing   Problem: Clinical Measurements: Goal: Ability to maintain clinical measurements within normal limits will improve Outcome: Progressing Goal: Will remain free from infection Outcome: Progressing Goal: Cardiovascular complication will be avoided Outcome: Progressing   Problem: Activity: Goal: Risk for activity intolerance will decrease Outcome: Progressing   Problem: Nutrition: Goal: Adequate nutrition will be maintained Outcome: Progressing   Problem: Elimination: Goal: Will not experience complications related to urinary retention Outcome: Progressing   Problem: Pain Management: Goal: General experience of comfort will improve Outcome: Progressing   Problem: Safety: Goal: Ability to remain free from injury will improve Outcome: Progressing   Problem: Skin Integrity: Goal: Risk for impaired skin integrity will decrease Outcome: Progressing

## 2023-12-14 NOTE — Transfer of Care (Signed)
Immediate Anesthesia Transfer of Care Note  Patient: Angela Bass  Procedure(s) Performed: ESOPHAGOGASTRODUODENOSCOPY (EGD)  Patient Location: PACU and Endoscopy Unit  Anesthesia Type:General  Level of Consciousness: awake  Airway & Oxygen Therapy: Patient Spontanous Breathing  Post-op Assessment: Report given to RN  Post vital signs: stable  Last Vitals:  Vitals Value Taken Time  BP 108/87 12/14/23 1248  Temp 36.3 C 12/14/23 1248  Pulse 81 12/14/23 1248  Resp 20 12/14/23 1248  SpO2 93 % 12/14/23 1248    Last Pain:  Vitals:   12/14/23 1248  TempSrc: Temporal  PainSc: 0-No pain        Past Medical History:  Diagnosis Date   DDD (degenerative disc disease), lumbar    walks with cane   Uterine cancer (HCC)    55 years ago   Past Surgical History:  Procedure Laterality Date   ABDOMINAL HYSTERECTOMY     BREAST EXCISIONAL BIOPSY Left 2011   Neg   Scheduled Meds:  [MAR Hold] Chlorhexidine Gluconate Cloth  6 each Topical Q0600   [MAR Hold] enoxaparin (LOVENOX) injection  40 mg Subcutaneous Q24H   [MAR Hold] pantoprazole (PROTONIX) IV  40 mg Intravenous Q24H   Continuous Infusions:  sodium chloride 20 mL/hr at 12/14/23 1220   [MAR Hold]  ceFAZolin (ANCEF) IV     lactated ringers 1,000 mL with potassium chloride 20 mEq infusion 75 mL/hr at 12/14/23 0933   PRN Meds:.[MAR Hold] acetaminophen **OR** [MAR Hold] acetaminophen, [MAR Hold]  HYDROmorphone (DILAUDID) injection, [MAR Hold] labetalol, [MAR Hold] ondansetron (ZOFRAN) IV   Complications: No notable events documented.

## 2023-12-14 NOTE — Hospital Course (Addendum)
Angela Bass is a 82 y.o. female with medical history significant of HTN, hiatal hernia, morbid obesity, presented with new onset of worsening vomiting abdominal pain. CT abdomen pelvis showed markedly dilated and fluid-filled stomach with rotation of the stomach along the long axis acute gastric volvulus.  EGD was performed on 12/26, could not pass gastric body due to hiatal hernia.  Hernia repair was performed on 12/27. Patient condition had improved, tolerating diet.  Barium swallow did not show any esophageal leak.  Discussed with general surgery, patient can discharge today.

## 2023-12-14 NOTE — Op Note (Signed)
[facility] Gastroenterology Patient Name: Angela Bass Procedure Date: 12/14/2023 12:04 PM MRN: [id number] Account #: [id number] Date of Birth: 08-23-1941 Admit Type: Inpatient Age: 82 Room: [facility] ENDO ROOM 4 Gender: Female Note Status: Finalized Instrument Name: Patton Salles Endoscope [id number] Procedure:             Upper GI endoscopy Indications:           Nausea with vomiting Providers:             Midge Minium MD, MD Referring MD:          Rhona Leavens. Burnett Sheng, MD (Referring MD) Medicines:             General Anesthesia Complications:         No immediate complications. Procedure:             Pre-Anesthesia Assessment:                        - Prior to the procedure, a History and Physical was                         performed, and patient medications and allergies were                         reviewed. The patient's tolerance of previous                         anesthesia was also reviewed. The risks and benefits                         of the procedure and the sedation options and risks                         were discussed with the patient. All questions were                         answered, and informed consent was obtained. Prior                         Anticoagulants: The patient has taken no anticoagulant                         or antiplatelet agents except for aspirin. ASA Grade                         Assessment: II - A patient with mild systemic disease.                         After reviewing the risks and benefits, the patient                         was deemed in satisfactory condition to undergo the                         procedure.                        After obtaining informed consent, the endoscope was  passed under direct vision. Throughout the procedure,                         the patient's blood pressure, pulse, and oxygen                         saturations were monitored continuously. The Endoscope                          was introduced through the mouth, and advanced to the                         body of the stomach. The upper GI endoscopy was                         accomplished without difficulty. The patient tolerated                         the procedure well. Findings:      A large paraesophageal hernia was found.      The cardia, gastric fundus and gastric body were normal.      NG tube in the fundus Impression:            - Large paraesophageal hernia.                        - Normal cardia, gastric fundus and gastric body.                        - NG tube in the fundus                        - No specimens collected.                        - The scope could not be passed beyond the body of the                         stomach due to the scope looping in the hernia Recommendation:        - Return patient to hospital ward for ongoing care.                        - NPO.                        - Continue present medications. Procedure Code(s):     --- Professional ---                        [id number], 52, Esophagogastroduodenoscopy, flexible,                         transoral; diagnostic, including collection of                         specimen(s) by brushing or washing, when performed                         (separate procedure) Diagnosis Code(s):     --- Professional ---  R11.2, Nausea with vomiting, unspecified                        K44.9, Diaphragmatic hernia without obstruction or                         gangrene CPT copyright 2022 American [employer] Association. All rights reserved. The codes documented in this report are preliminary and upon coder review may  be revised to meet current compliance requirements. Midge Minium MD, MD 12/14/2023 12:45:25 PM This report has been signed electronically. Number of Addenda: 0 Note Initiated On: 12/14/2023 12:04 PM Estimated Blood Loss:  Estimated blood loss: none.      [facility]

## 2023-12-14 NOTE — Anesthesia Preprocedure Evaluation (Signed)
Anesthesia Evaluation  Patient identified by MRN, date of birth, ID band Patient awake    Reviewed: Allergy & Precautions, NPO status , Patient's Chart, lab work & pertinent test results  History of Anesthesia Complications (+) PONV and history of anesthetic complications  Airway Mallampati: III  TM Distance: <3 FB Neck ROM: full    Dental  (+) Missing   Pulmonary neg pulmonary ROS, neg shortness of breath   Pulmonary exam normal        Cardiovascular Exercise Tolerance: Good (-) angina (-) Past MI and (-) DOE negative cardio ROS Normal cardiovascular exam     Neuro/Psych negative neurological ROS  negative psych ROS   GI/Hepatic Neg liver ROS, hiatal hernia,GERD  Controlled,,  Endo/Other  negative endocrine ROS    Renal/GU      Musculoskeletal   Abdominal   Peds  Hematology negative hematology ROS (+)   Anesthesia Other Findings Past Medical History: No date: DDD (degenerative disc disease), lumbar     Comment:  walks with cane No date: Uterine cancer (HCC)     Comment:  55 years ago  Past Surgical History: No date: ABDOMINAL HYSTERECTOMY 2011: BREAST EXCISIONAL BIOPSY; Left     Comment:  Neg  BMI    Body Mass Index: 42.99 kg/m      Reproductive/Obstetrics negative OB ROS                             Anesthesia Physical Anesthesia Plan  ASA: 4  Anesthesia Plan: General ETT, Rapid Sequence and Cricoid Pressure   Post-op Pain Management:    Induction: Intravenous  PONV Risk Score and Plan: Ondansetron, Dexamethasone and Treatment may vary due to age or medical condition  Airway Management Planned: Oral ETT and Video Laryngoscope Planned  Additional Equipment:   Intra-op Plan:   Post-operative Plan: Extubation in OR  Informed Consent: I have reviewed the patients History and Physical, chart, labs and discussed the procedure including the risks, benefits and  alternatives for the proposed anesthesia with the patient or authorized representative who has indicated his/her understanding and acceptance.     Dental Advisory Given  Plan Discussed with: Anesthesiologist, CRNA and Surgeon  Anesthesia Plan Comments: (Patient consented for risks of anesthesia including but not limited to:  - adverse reactions to medications - damage to eyes, teeth, lips or other oral mucosa - nerve damage due to positioning  - sore throat or hoarseness - Damage to heart, brain, nerves, lungs, other parts of body or loss of life  Patient voiced understanding and assent.)       Anesthesia Quick Evaluation

## 2023-12-14 NOTE — Anesthesia Postprocedure Evaluation (Signed)
Anesthesia Post Note  Patient: Angela Bass  Procedure(s) Performed: ESOPHAGOGASTRODUODENOSCOPY (EGD)  Patient location during evaluation: PACU Anesthesia Type: General Level of consciousness: awake and alert Pain management: pain level controlled Vital Signs Assessment: post-procedure vital signs reviewed and stable Respiratory status: spontaneous breathing, nonlabored ventilation, respiratory function stable and patient connected to nasal cannula oxygen Cardiovascular status: blood pressure returned to baseline and stable Postop Assessment: no apparent nausea or vomiting Anesthetic complications: no   No notable events documented.   Last Vitals:  Vitals:   12/14/23 1258 12/14/23 1308  BP: 116/63 132/62  Pulse: 78 74  Resp: (!) 21 19  Temp:    SpO2: 97% 100%    Last Pain:  Vitals:   12/14/23 1308  TempSrc:   PainSc: 0-No pain                 Cleda Mccreedy Sachi Boulay

## 2023-12-15 ENCOUNTER — Inpatient Hospital Stay: Payer: Medicare HMO | Admitting: Anesthesiology

## 2023-12-15 ENCOUNTER — Encounter
Admission: EM | Disposition: A | Discharge status: Home / Self Care | Payer: Self-pay | Source: Home / Self Care | Attending: Internal Medicine

## 2023-12-15 ENCOUNTER — Encounter: Payer: Self-pay | Admitting: Internal Medicine

## 2023-12-15 DIAGNOSIS — K311 Adult hypertrophic pyloric stenosis: Secondary | ICD-10-CM | POA: Diagnosis not present

## 2023-12-15 DIAGNOSIS — E876 Hypokalemia: Secondary | ICD-10-CM

## 2023-12-15 DIAGNOSIS — K440 Diaphragmatic hernia with obstruction, without gangrene: Principal | ICD-10-CM

## 2023-12-15 HISTORY — PX: INSERTION OF MESH: SHX5868

## 2023-12-15 HISTORY — PX: XI ROBOTIC ASSISTED HIATAL HERNIA REPAIR: SHX6889

## 2023-12-15 LAB — BASIC METABOLIC PANEL
Anion gap: 11 (ref 5–15)
BUN: 19 mg/dL (ref 8–23)
CO2: 27 mmol/L (ref 22–32)
Calcium: 8.9 mg/dL (ref 8.9–10.3)
Chloride: 102 mmol/L (ref 98–111)
Creatinine, Ser: 0.72 mg/dL (ref 0.44–1.00)
GFR, Estimated: >60 mL/min (ref >60)
Glucose, Bld: 95 mg/dL (ref 70–99)
Potassium: 4.7 mmol/L (ref 3.5–5.1)
Sodium: 140 mmol/L (ref 135–145)

## 2023-12-15 LAB — URINE CULTURE: Culture: 70000 — AB

## 2023-12-15 LAB — GLUCOSE, CAPILLARY: Glucose-Capillary: 129 mg/dL — ABNORMAL HIGH (ref 70–99)

## 2023-12-15 LAB — MAGNESIUM: Magnesium: 2.1 mg/dL (ref 1.7–2.4)

## 2023-12-15 SURGERY — REPAIR, HERNIA, HIATAL, ROBOT-ASSISTED
Anesthesia: General

## 2023-12-15 MED ORDER — PHENYLEPHRINE 80 MCG/ML (10ML) SYRINGE FOR IV PUSH (FOR BLOOD PRESSURE SUPPORT)
PREFILLED_SYRINGE | INTRAVENOUS | Status: DC | PRN
  Administered 2023-12-15 (×2): 80 ug via INTRAVENOUS

## 2023-12-15 MED ORDER — OXYCODONE HCL 5 MG/5ML PO SOLN: 5.0000 mg | Freq: Once | ORAL | Status: DC | PRN

## 2023-12-15 MED ORDER — LIDOCAINE HCL (CARDIAC) PF 100 MG/5ML IV SOSY
PREFILLED_SYRINGE | INTRAVENOUS | Status: DC | PRN
  Administered 2023-12-15: 100 mg via INTRAVENOUS

## 2023-12-15 MED ORDER — ACETAMINOPHEN 10 MG/ML IV SOLN
1000.0000 mg | Freq: Four times a day (QID) | INTRAVENOUS | Status: AC
  Administered 2023-12-15 – 2023-12-16 (×4): 1000 mg via INTRAVENOUS
  Filled 2023-12-15 (×4): qty 100

## 2023-12-15 MED ORDER — SUCCINYLCHOLINE CHLORIDE 200 MG/10ML IV SOSY
PREFILLED_SYRINGE | INTRAVENOUS | Status: DC | PRN
  Administered 2023-12-15: 140 mg via INTRAVENOUS

## 2023-12-15 MED ORDER — ONDANSETRON HCL 4 MG/2ML IJ SOLN
INTRAMUSCULAR | Status: DC | PRN
  Administered 2023-12-15: 4 mg via INTRAVENOUS

## 2023-12-15 MED ORDER — PROPOFOL 10 MG/ML IV BOLUS
INTRAVENOUS | Status: AC
  Filled 2023-12-15: qty 20

## 2023-12-15 MED ORDER — ACETAMINOPHEN 10 MG/ML IV SOLN
INTRAVENOUS | Status: AC
  Filled 2023-12-15: qty 100

## 2023-12-15 MED ORDER — FENTANYL CITRATE (PF) 100 MCG/2ML IJ SOLN
INTRAMUSCULAR | Status: DC | PRN
  Administered 2023-12-15: 100 ug via INTRAVENOUS

## 2023-12-15 MED ORDER — FENTANYL CITRATE (PF) 100 MCG/2ML IJ SOLN
INTRAMUSCULAR | Status: AC
  Filled 2023-12-15: qty 2

## 2023-12-15 MED ORDER — BUPIVACAINE LIPOSOME 1.3 % IJ SUSP
INTRAMUSCULAR | Status: AC
  Filled 2023-12-15: qty 20

## 2023-12-15 MED ORDER — LACTATED RINGERS IV SOLN: INTRAVENOUS | Status: DC | PRN

## 2023-12-15 MED ORDER — KETOROLAC TROMETHAMINE 15 MG/ML IJ SOLN
INTRAMUSCULAR | Status: AC
  Filled 2023-12-15: qty 1

## 2023-12-15 MED ORDER — VISTASEAL 10 ML SINGLE DOSE KIT
PACK | CUTANEOUS | Status: AC
  Filled 2023-12-15: qty 10

## 2023-12-15 MED ORDER — VISTASEAL 10 ML SINGLE DOSE KIT
PACK | CUTANEOUS | Status: DC | PRN
  Administered 2023-12-15: 10 mL via TOPICAL

## 2023-12-15 MED ORDER — BUPIVACAINE-EPINEPHRINE (PF) 0.25% -1:200000 IJ SOLN
INTRAMUSCULAR | Status: AC
  Filled 2023-12-15: qty 30

## 2023-12-15 MED ORDER — CEFAZOLIN SODIUM-DEXTROSE 2-4 GM/100ML-% IV SOLN
INTRAVENOUS | Status: AC
  Filled 2023-12-15: qty 100

## 2023-12-15 MED ORDER — SUGAMMADEX SODIUM 200 MG/2ML IV SOLN
INTRAVENOUS | Status: DC | PRN
  Administered 2023-12-15: 200 mg via INTRAVENOUS

## 2023-12-15 MED ORDER — DEXAMETHASONE SODIUM PHOSPHATE 10 MG/ML IJ SOLN
INTRAMUSCULAR | Status: DC | PRN
  Administered 2023-12-15: 10 mg via INTRAVENOUS

## 2023-12-15 MED ORDER — SODIUM CHLORIDE (PF) 0.9 % IJ SOLN
INTRAMUSCULAR | Status: AC
  Filled 2023-12-15: qty 50

## 2023-12-15 MED ORDER — KETOROLAC TROMETHAMINE 15 MG/ML IJ SOLN
15.0000 mg | Freq: Four times a day (QID) | INTRAMUSCULAR | Status: DC
  Administered 2023-12-15 – 2023-12-18 (×12): 15 mg via INTRAVENOUS
  Filled 2023-12-15 (×11): qty 1

## 2023-12-15 MED ORDER — ONDANSETRON HCL 4 MG/2ML IJ SOLN: 4.0000 mg | Freq: Once | INTRAMUSCULAR | Status: DC | PRN

## 2023-12-15 MED ORDER — KETAMINE HCL 50 MG/5ML IJ SOSY
PREFILLED_SYRINGE | INTRAMUSCULAR | Status: DC | PRN
  Administered 2023-12-15: 30 mg via INTRAVENOUS
  Administered 2023-12-15 (×2): 10 mg via INTRAVENOUS

## 2023-12-15 MED ORDER — METHYLENE BLUE (ANTIDOTE) 1 % IV SOLN
INTRAVENOUS | Status: DC | PRN
  Administered 2023-12-15: 10 mL via INTRAVENOUS

## 2023-12-15 MED ORDER — FENTANYL CITRATE (PF) 100 MCG/2ML IJ SOLN: 25.0000 ug | INTRAMUSCULAR | Status: DC | PRN

## 2023-12-15 MED ORDER — PROPOFOL 10 MG/ML IV BOLUS
INTRAVENOUS | Status: DC | PRN
  Administered 2023-12-15: 120 mg via INTRAVENOUS

## 2023-12-15 MED ORDER — BUPIVACAINE LIPOSOME 1.3 % IJ SUSP
INTRAMUSCULAR | Status: DC | PRN
  Administered 2023-12-15: 20 mL

## 2023-12-15 MED ORDER — EPHEDRINE SULFATE-NACL 50-0.9 MG/10ML-% IV SOSY
PREFILLED_SYRINGE | INTRAVENOUS | Status: DC | PRN
  Administered 2023-12-15: 10 mg via INTRAVENOUS

## 2023-12-15 MED ORDER — PHENYLEPHRINE HCL-NACL 20-0.9 MG/250ML-% IV SOLN
INTRAVENOUS | Status: AC
  Filled 2023-12-15: qty 250

## 2023-12-15 MED ORDER — METHYLENE BLUE (ANTIDOTE) 1 % IV SOLN
INTRAVENOUS | Status: AC
  Filled 2023-12-15: qty 10

## 2023-12-15 MED ORDER — OXYCODONE HCL 5 MG PO TABS: 5.0000 mg | ORAL_TABLET | Freq: Once | ORAL | Status: DC | PRN

## 2023-12-15 MED ORDER — CEFAZOLIN SODIUM-DEXTROSE 2-4 GM/100ML-% IV SOLN
2.0000 g | Freq: Three times a day (TID) | INTRAVENOUS | Status: AC
  Administered 2023-12-15 – 2023-12-16 (×3): 2 g via INTRAVENOUS
  Filled 2023-12-15 (×3): qty 100

## 2023-12-15 MED ORDER — KETAMINE HCL 50 MG/5ML IJ SOSY
PREFILLED_SYRINGE | INTRAMUSCULAR | Status: AC
  Filled 2023-12-15: qty 5

## 2023-12-15 MED ORDER — ACETAMINOPHEN 10 MG/ML IV SOLN
INTRAVENOUS | Status: DC | PRN
  Administered 2023-12-15: 1000 mg via INTRAVENOUS

## 2023-12-15 MED ORDER — LACTATED RINGERS IV SOLN: INTRAVENOUS | Status: DC

## 2023-12-15 MED ORDER — BUPIVACAINE-EPINEPHRINE 0.25% -1:200000 IJ SOLN
INTRAMUSCULAR | Status: DC | PRN
  Administered 2023-12-15: 30 mL

## 2023-12-15 MED ORDER — ROCURONIUM BROMIDE 100 MG/10ML IV SOLN
INTRAVENOUS | Status: DC | PRN
  Administered 2023-12-15: 30 mg via INTRAVENOUS
  Administered 2023-12-15: 40 mg via INTRAVENOUS
  Administered 2023-12-15: 30 mg via INTRAVENOUS

## 2023-12-15 MED ORDER — PHENYLEPHRINE HCL-NACL 20-0.9 MG/250ML-% IV SOLN
INTRAVENOUS | Status: DC | PRN
  Administered 2023-12-15: 20 ug/min via INTRAVENOUS

## 2023-12-15 SURGICAL SUPPLY — 51 items
APPLICATOR VISTASEAL 35 (MISCELLANEOUS) IMPLANT
CANNULA REDUCER 12-8 DVNC XI (CANNULA) ×2 IMPLANT
CLIP LIGATING HEM O LOK PURPLE (MISCELLANEOUS) IMPLANT
DERMABOND ADVANCED .7 DNX12 (GAUZE/BANDAGES/DRESSINGS) ×2 IMPLANT
DRAPE ARM DVNC X/XI (DISPOSABLE) ×8 IMPLANT
DRAPE COLUMN DVNC XI (DISPOSABLE) ×2 IMPLANT
ELECT REM PT RETURN 9FT ADLT (ELECTROSURGICAL) ×2
ELECTRODE REM PT RTRN 9FT ADLT (ELECTROSURGICAL) ×2 IMPLANT
FORCEPS BPLR R/ABLATION 8 DVNC (INSTRUMENTS) ×2 IMPLANT
GLOVE BIO SURGEON STRL SZ7 (GLOVE) ×6 IMPLANT
GOWN STRL REUS W/ TWL LRG LVL3 (GOWN DISPOSABLE) ×8 IMPLANT
GRASPER LAPSCPC 5X45 DSP (INSTRUMENTS) ×2 IMPLANT
GRASPER TIP-UP FEN DVNC XI (INSTRUMENTS) ×2 IMPLANT
IRRIGATION STRYKERFLOW (MISCELLANEOUS) IMPLANT
IRRIGATOR STRYKERFLOW (MISCELLANEOUS) ×2
IV NS 1000ML BAXH (IV SOLUTION) IMPLANT
KIT IMAGING PINPOINTPAQ (MISCELLANEOUS) ×2 IMPLANT
KIT PINK PAD W/HEAD ARE REST (MISCELLANEOUS) ×2
KIT PINK PAD W/HEAD ARM REST (MISCELLANEOUS) ×2 IMPLANT
LABEL OR SOLS (LABEL) ×2 IMPLANT
MANIFOLD NEPTUNE II (INSTRUMENTS) ×2 IMPLANT
MESH BIO-A 7X10 SYN MAT (Mesh General) IMPLANT
NDL DRIVE SUT CUT DVNC (INSTRUMENTS) ×2 IMPLANT
NDL HYPO 22X1.5 SAFETY MO (MISCELLANEOUS) ×2 IMPLANT
NEEDLE DRIVE SUT CUT DVNC (INSTRUMENTS) ×2
NEEDLE HYPO 22X1.5 SAFETY MO (MISCELLANEOUS) ×2
OBTURATOR OPTICAL STND 8 DVNC (TROCAR) ×2
OBTURATOR OPTICALSTD 8 DVNC (TROCAR) ×2 IMPLANT
PACK LAP CHOLECYSTECTOMY (MISCELLANEOUS) ×2 IMPLANT
SEAL UNIV 5-12 XI (MISCELLANEOUS) ×8 IMPLANT
SEALER VESSEL EXT DVNC XI (MISCELLANEOUS) ×2 IMPLANT
SOL ELECTROSURG ANTI STICK (MISCELLANEOUS) ×2
SOLUTION ELECTROSURG ANTI STCK (MISCELLANEOUS) ×2 IMPLANT
SPIKE FLUID TRANSFER (MISCELLANEOUS) ×2 IMPLANT
SPONGE T-LAP 18X18 ~~LOC~~+RFID (SPONGE) ×2 IMPLANT
SUT MNCRL 4-0 27XMFL (SUTURE) ×2
SUT SILK 2 0 SH (SUTURE) ×4 IMPLANT
SUT STRATA 2-0 23CM CT-2 (SUTURE) ×2 IMPLANT
SUT VIC AB 3-0 SH 27X BRD (SUTURE) IMPLANT
SUT VICRYL 0 UR6 27IN ABS (SUTURE) ×4 IMPLANT
SUTURE MNCRL 4-0 27XMF (SUTURE) ×2 IMPLANT
SYR 30ML LL (SYRINGE) ×2 IMPLANT
SYR TOOMEY IRRIG 70ML (MISCELLANEOUS) ×2
SYRINGE TOOMEY IRRIG 70ML (MISCELLANEOUS) ×2 IMPLANT
SYS BAG RETRIEVAL 10MM (BASKET) ×2
SYSTEM BAG RETRIEVAL 10MM (BASKET) IMPLANT
TRAP FLUID SMOKE EVACUATOR (MISCELLANEOUS) ×2 IMPLANT
TRAY FOLEY SLVR 16FR LF STAT (SET/KITS/TRAYS/PACK) ×2 IMPLANT
TROCAR Z-THREAD FIOS 5X100MM (TROCAR) ×2 IMPLANT
TUBING EVAC SMOKE HEATED PNEUM (TUBING) ×2 IMPLANT
WATER STERILE IRR 500ML POUR (IV SOLUTION) ×2 IMPLANT

## 2023-12-15 NOTE — Op Note (Addendum)
Robotic assisted laparoscopic repair of  paraesophageal  hernia with Bio-A Mesh    Pre-operative Diagnosis: GERD, hiatal hernia   Post-operative Diagnosis: same   Procedure:  Robotic assisted laparoscopic repair of  paraesophageal  hernia with Bio-A Mesh    Surgeon: Sterling Big, MD FACS   Assistant: . Griffin Basil RNFA   Anesthesia: Gen. with endotracheal tube   Findings: Giant Type III paraesophageal hernia w 2/3 of stomach within mediastinum, in an upside down configuration Very difficult case due to the giant hernia with a BMI of 43 and chronic inflammatory changes within the mediastinum and the fundus No evidence of bowel or esophageal injuries Given significant fragile mediastinum and tissues, as well as unknown esophageal function,  I did not remove the hernia sac nor did a fundoplication due to potential harm   Estimated Blood Loss: 50cc            Complications: none   Procedure Details  The patient was seen again in the Holding Room. The benefits, complications, treatment options, and expected outcomes were discussed with the patient. The risks of bleeding, infection, recurrence of symptoms, failure to resolve symptoms,  esophageal damage, Dysphagia, bowel injury, any of which could require further surgery were reviewed with the patient. The likelihood of improving the patient's symptoms with return to their baseline status is good.  The patient and/or family concurred with the proposed plan, giving informed consent.  The patient was taken to Operating Room, identified  and the procedure verified.  A Time Out was held and the above information confirmed.   Prior to the induction of general anesthesia, antibiotic prophylaxis was administered. VTE prophylaxis was in place. General endotracheal anesthesia was then administered and tolerated well. After the induction, the abdomen was prepped with Chloraprep and draped in the sterile fashion. The patient was positioned in the supine  position.   Cut down technique was used to enter the abdominal cavity and a Hasson trochar was placed after two vicryl stitches were anchored to the fascia. Pneumoperitoneum was then created with CO2 and tolerated well without any adverse changes in the patient's vital signs.  Three 8-mm ports were placed under direct vision. All skin incisions  were infiltrated with a local anesthetic agent before making the incision and placing the trocars. An additional 5 mm regular laparoscopic port was placed to assist with retraction and exposure.    The patient was positioned  in reverse Trendelenburg, robot was brought to the surgical field and docked in the standard fashion.  We made sure all the instrumentation was kept indirect view at all times and that there were no collision between the arms. I scrubbed out and went to the console.   I used a robotic arm to retract the liver, the vessel sealer on my right hand and a forced bipolar grasper on my left hand.  There is along the extra 5 mm port allow me ample exposure and the ability to perform meticulous dissection   We Started dividing the lesser omentum via the pars flaccida.  We Were able to dissect the lesser curvature of the stomach and  dissected the fundus free from the right and left crus.     Please note that given her High BMI and the fact that 2/3 of the stomach were within mediastinum this was a very difficult case requiring extensive and meticulous dissection. I also had to divide the left gastric artery and vessels as this was a big component of the incarcerated hernia.  We circumferentially dissected the GE junction.  The hernia sac was also completely reduced and we were able to bring the stomach into the intra-abdominal position.  Attention then was turned to the greater curvature where the short gastrics were divided with sealer device.  We were able to identify the left crus and again were able to make sure there was a good  circumferential dissection and that the hernia sac was completely excised.  We did perform a good dissection within the mediastinum to allow a complete reduction of the sac, gain esophageal length and a to completely allow an intra-abdominal fundoplication. Using two strips of Bio-A as pledgets we approximated the crus with a 2-0 stratafix suture. A bio-A 10x7 cm mesh was inserted and secured using Vistaseal.   We Asked anesthesia to place a 30 French bougie and this went easily.  We also observe trajectory of the bougie. I also asked anesthesia to place methylene blue via NG, no evidence of esophageal or gastric injuries were seen. I decided not to do a fundoplication as this was an acute case and I did not have the luxury to assess esophageal function..  I was very happy with the repair of the hernia considering all the challenges that we faced.   Inspection of the  upper quadrant was performed. No bleeding, bile  Or esophageal injuries leaks, or bowel injuries were noted. Robotic instruments and robotic arms were undocked in the standard fashion. All the needles were removed under direct visualization.   I scrubbed back in. There was a small traction laceration in the liver that we cauterized.    Pneumoperitoneum was released.  The periumbilical port site was closed with interrumpted 0 Vicryl sutures. 4-0 subcuticular Monocryl was used to close the skin. Liposomal marcaine was injected to all the incisions sites.   Dermabond was  applied.  The patient was then extubated and brought to the recovery room in stable condition. Sponge, lap, and needle counts were correct at closure and at the conclusion of the case.          Modifier 22 applied due to the complexity of the case with significant more time spent in dissection of the mediastinum and reduction of this giant incarcerated hernia/

## 2023-12-15 NOTE — Progress Notes (Signed)
Robotic assisted laparoscopic repair of  paraesophageal  hernia with Bio-A Mesh   Pre-operative Diagnosis: GERD, hiatal hernia  Post-operative Diagnosis: same  Procedure:  Robotic assisted laparoscopic repair of  paraesophageal  hernia with Bio-A Mesh   Surgeon: Sterling Big, MD FACS  Assistant: . Griffin Basil RNFA  Anesthesia: Gen. with endotracheal tube  Findings: Giant Type III paraesophageal hernia w 2/3 of stomach within mediastinum, in an upside down configuration Very difficult case due to the giant hernia with a BMI of 43 and chronic inflammatory changes within the mediastinum and the fundus No evidence of bowel or esophageal injuries Given significant fragile mediastinum and tissues, as well as unknown esophageal function,  I did not remove the hernia sac nor did a fundoplication due to potential harm  Estimated Blood Loss: 50cc            Complications: none  Procedure Details  The patient was seen again in the Holding Room. The benefits, complications, treatment options, and expected outcomes were discussed with the patient. The risks of bleeding, infection, recurrence of symptoms, failure to resolve symptoms,  esophageal damage, Dysphagia, bowel injury, any of which could require further surgery were reviewed with the patient. The likelihood of improving the patient's symptoms with return to their baseline status is good.  The patient and/or family concurred with the proposed plan, giving informed consent.  The patient was taken to Operating Room, identified  and the procedure verified.  A Time Out was held and the above information confirmed.  Prior to the induction of general anesthesia, antibiotic prophylaxis was administered. VTE prophylaxis was in place. General endotracheal anesthesia was then administered and tolerated well. After the induction, the abdomen was prepped with Chloraprep and draped in the sterile fashion. The patient was positioned in the supine  position.  Cut down technique was used to enter the abdominal cavity and a Hasson trochar was placed after two vicryl stitches were anchored to the fascia. Pneumoperitoneum was then created with CO2 and tolerated well without any adverse changes in the patient's vital signs.  Three 8-mm ports were placed under direct vision. All skin incisions  were infiltrated with a local anesthetic agent before making the incision and placing the trocars. An additional 5 mm regular laparoscopic port was placed to assist with retraction and exposure.   The patient was positioned  in reverse Trendelenburg, robot was brought to the surgical field and docked in the standard fashion.  We made sure all the instrumentation was kept indirect view at all times and that there were no collision between the arms. I scrubbed out and went to the console.  I used a robotic arm to retract the liver, the vessel sealer on my right hand and a forced bipolar grasper on my left hand.  There is along the extra 5 mm port allow me ample exposure and the ability to perform meticulous dissection  We Started dividing the lesser omentum via the pars flaccida.  We Were able to dissect the lesser curvature of the stomach and  dissected the fundus free from the right and left crus.    Please note that given her High BMI and the fact that 2/3 of the stomach were within mediastinum this was a very difficult case requiring extensive and meticulous dissection. I also had to divide the left gastric artery and vessels as this was a big component of the incarcerated hernia.   We circumferentially dissected the GE junction.  The hernia sac was also completely  reduced and we were able to bring the stomach into the intra-abdominal position.  Attention then was turned to the greater curvature where the short gastrics were divided with sealer device.  We were able to identify the left crus and again were able to make sure there was a good circumferential  dissection and that the hernia sac was completely excised.  We did perform a good dissection within the mediastinum to allow a complete reduction of the sac, gain esophageal length and a to completely allow an intra-abdominal fundoplication. Using two strips of Bio-A as pledgets we approximated the crus with a 2-0 stratafix suture. A bio-A 10x7 cm mesh was inserted and secured using Vistaseal.   We Asked anesthesia to place a 50 French bougie and this went easily.  We also observe trajectory of the bougie. I decided not to do a fundoplication as this was an acute case and I did not have the luxury to assess esophageal function..  I was very happy with the repair of the hernia considering all the challenges that we faced.  Inspection of the  upper quadrant was performed. No bleeding, bile  Or esophageal injuries leaks, or bowel injuries were noted. Robotic instruments and robotic arms were undocked in the standard fashion. All the needles were removed under direct visualization.   I scrubbed back in. There was a small traction laceration in the liver that we cauterized.   Pneumoperitoneum was released.  The periumbilical port site was closed with interrumpted 0 Vicryl sutures. 4-0 subcuticular Monocryl was used to close the skin. Liposomal marcaine was injected to all the incisions sites.  Dermabond was  applied.  The patient was then extubated and brought to the recovery room in stable condition. Sponge, lap, and needle counts were correct at closure and at the conclusion of the case.          Modifier 22 applied due to the complexity of the case with significant more time spent in dissection of the mediastinum and reduction of this giant incarcerated hernia/     Sterling Big, MD, FACS

## 2023-12-15 NOTE — Anesthesia Preprocedure Evaluation (Signed)
Anesthesia Evaluation  Patient identified by MRN, date of birth, ID band Patient awake    Reviewed: Allergy & Precautions, NPO status , Patient's Chart, lab work & pertinent test results  Airway Mallampati: II  TM Distance: >3 FB Neck ROM: full    Dental  (+) Upper Dentures, Lower Dentures   Pulmonary neg pulmonary ROS   Pulmonary exam normal  + decreased breath sounds      Cardiovascular Exercise Tolerance: Poor negative cardio ROS Normal cardiovascular exam Rhythm:Regular Rate:Normal     Neuro/Psych negative neurological ROS  negative psych ROS   GI/Hepatic negative GI ROS, Neg liver ROS, hiatal hernia,,,  Endo/Other  negative endocrine ROS  Class 3 obesity  Renal/GU negative Renal ROS     Musculoskeletal   Abdominal  (+) + obese  Peds  Hematology negative hematology ROS (+)   Anesthesia Other Findings Past Medical History: No date: DDD (degenerative disc disease), lumbar     Comment:  walks with cane No date: Uterine cancer (HCC)     Comment:  55 years ago  Past Surgical History: No date: ABDOMINAL HYSTERECTOMY 2011: BREAST EXCISIONAL BIOPSY; Left     Comment:  Neg 12/14/2023: ESOPHAGOGASTRODUODENOSCOPY; N/A     Comment:  Procedure: ESOPHAGOGASTRODUODENOSCOPY (EGD);  Surgeon:               Midge Minium, MD;  Location: [facility] ENDOSCOPY;  Service:               Endoscopy;  Laterality: N/A;  BMI    Body Mass Index: 42.99 kg/m      Reproductive/Obstetrics negative OB ROS                             Anesthesia Physical Anesthesia Plan  ASA: 3  Anesthesia Plan: General   Post-op Pain Management:    Induction: Intravenous  PONV Risk Score and Plan: 1 and Ondansetron and Dexamethasone  Airway Management Planned: Oral ETT  Additional Equipment:   Intra-op Plan:   Post-operative Plan: Extubation in OR  Informed Consent: I have reviewed the patients History and  Physical, chart, labs and discussed the procedure including the risks, benefits and alternatives for the proposed anesthesia with the patient or authorized representative who has indicated his/her understanding and acceptance.     Dental Advisory Given  Plan Discussed with: CRNA  Anesthesia Plan Comments:        Anesthesia Quick Evaluation

## 2023-12-15 NOTE — Progress Notes (Signed)
pt seen and examined. All questions answered form the family. Risks of bleeding, infection, esophageal or bowel injuries, resp failure d/w them in detail. They agreed to proceed

## 2023-12-15 NOTE — Progress Notes (Signed)
  Progress Note   Patient: Angela Bass ZDG:[id number] DOB: Jul 14, 1941 DOA: 12/13/2023     2 DOS: the patient was seen and examined on 12/15/2023   Brief hospital course: Angela Bass is a 82 y.o. female with medical history significant of HTN, hiatal hernia, morbid obesity, presented with new onset of worsening vomiting abdominal pain. CT abdomen pelvis showed markedly dilated and fluid-filled stomach with rotation of the stomach along the long axis acute gastric volvulus.  EGD was performed on 12/26, could not pass gastric body due to hiatal hernia.  Hernia repair was performed on 12/27.   Principal Problem:   Gastric volvulus Active Problems:   Acute gastric volvulus   Partial gastric outlet obstruction   Paraesophageal hernia   Hypokalemia   Hiatal hernia with obstruction but no gangrene   Assessment and Plan: Gastric outlet obstruction. Giant hiatal hernia. Gastric volvulus. Patient is status post hernia repair today, doing well.  Currently n.p.o., started fluids.  Essential hypertension. Continue prn blood pressure medicine.   Morbid obesity. Continue to follow.   Hypokalemia. Potassium normalized, with IV fluids, recheck BMP tomorrow.         Subjective:  Patient doing well today on postop.  Physical Exam: Vitals:   12/15/23 1221 12/15/23 1230 12/15/23 1300 12/15/23 1328  BP: (!) 115/52  130/63 124/61  Pulse: 90  79 82  Resp: 16 14 16 16   Temp: 97.6 F (36.4 C)  97.8 F (36.6 C) 97.7 F (36.5 C)  TempSrc:    Oral  SpO2: 100%  98% 99%  Weight:      Height:       General exam: Appears calm and comfortable  Respiratory system: Clear to auscultation. Respiratory effort normal. Cardiovascular system: S1 & S2 heard, RRR. No JVD, murmurs, rubs, gallops or clicks. No pedal edema. Gastrointestinal system: Abdomen is nondistended, soft and nontender. No organomegaly or masses felt.  Decreased bowel sounds. Central nervous system: Alert and oriented x3.  No focal neurological deficits. Extremities: Symmetric 5 x 5 power. Skin: No rashes, lesions or ulcers Psychiatry: Judgement and insight appear normal. Mood & affect appropriate.    Data Reviewed:  Lab results reviewed.  Family Communication: Family updated at bedside.  Disposition: Status is: Inpatient Remains inpatient appropriate because: Severity of disease, postop.     Time spent: 35 minutes  Author: Marrion Coy, MD 12/15/2023 1:43 PM  For on call review [email or web address].

## 2023-12-15 NOTE — TOC CM/SW Note (Signed)
Transition of Care [facility]) - Inpatient Brief Assessment   Patient Details  Name: Angela Bass MRN: [id number] Date of Birth: Dec 17, 1941  [facility] CM/SW Contact:    Margarito Liner, LCSW Phone Number: 12/15/2023, 3:48 PM   Clinical Narrative: CSW reviewed chart. No TOC needs identified at this time. CSW will continue to follow progress. Please place [facility] consult if any needs arise.  Transition of Care Asessment: Insurance and Status: Insurance coverage has been reviewed Patient has primary care physician: Yes Home environment has been reviewed: Single family home Prior level of function:: Not documented Prior/Current Home Services: No current home services Social Drivers of Health Review: SDOH reviewed no interventions necessary Readmission risk has been reviewed: Yes Transition of care needs: no transition of care needs at this time

## 2023-12-15 NOTE — Plan of Care (Signed)
  Problem: Nutrition: Goal: Adequate nutrition will be maintained Outcome: Progressing   Problem: Coping: Goal: Level of anxiety will decrease Outcome: Progressing   Problem: Pain Management: Goal: General experience of comfort will improve Outcome: Progressing   Problem: Safety: Goal: Ability to remain free from injury will improve Outcome: Progressing

## 2023-12-15 NOTE — Plan of Care (Signed)

## 2023-12-15 NOTE — Transfer of Care (Signed)
Immediate Anesthesia Transfer of Care Note  Patient: Angela Bass  Procedure(s) Performed: XI ROBOTIC ASSISTED PARAESOPHAGEAL INSERTION OF MESH  Patient Location: PACU  Anesthesia Type:General  Level of Consciousness: awake and drowsy  Airway & Oxygen Therapy: Patient Spontanous Breathing and Patient connected to face mask oxygen  Post-op Assessment: Report given to RN and Post -op Vital signs reviewed and stable  Post vital signs: Reviewed and stable  Last Vitals:  Vitals Value Taken Time  BP 115/52 12/15/23 1220  Temp    Pulse 89 12/15/23 1223  Resp 18 12/15/23 1223  SpO2 100 % 12/15/23 1223  Vitals shown include unfiled device data.  Last Pain:  Vitals:   12/15/23 0810  TempSrc:   PainSc: 0-No pain         Complications: No notable events documented.

## 2023-12-15 NOTE — Anesthesia Procedure Notes (Signed)
Procedure Name: Intubation Date/Time: 12/15/2023 9:15 AM  Performed by: Cheral Bay, CRNAPre-anesthesia Checklist: Patient identified, Emergency Drugs available, Suction available and Patient being monitored Patient Re-evaluated:Patient Re-evaluated prior to induction Oxygen Delivery Method: Circle system utilized Preoxygenation: Pre-oxygenation with 100% oxygen Induction Type: IV induction Laryngoscope Size: McGrath and 3 Grade View: Grade I Tube type: Oral Tube size: 7.0 mm Number of attempts: 1 Airway Equipment and Method: Stylet Placement Confirmation: ETT inserted through vocal cords under direct vision, positive ETCO2 and breath sounds checked- equal and bilateral Secured at: 20 cm Tube secured with: Tape Dental Injury: Teeth and Oropharynx as per pre-operative assessment

## 2023-12-15 NOTE — Anesthesia Postprocedure Evaluation (Signed)
Anesthesia Post Note  Patient: Angela Bass  Procedure(s) Performed: XI ROBOTIC ASSISTED PARAESOPHAGEAL INSERTION OF MESH  Patient location during evaluation: PACU Anesthesia Type: General Pain management: pain level controlled Vital Signs Assessment: post-procedure vital signs reviewed and stable Respiratory status: nonlabored ventilation Cardiovascular status: blood pressure returned to baseline Anesthetic complications: no   No notable events documented.   Last Vitals:  Vitals:   12/15/23 0746 12/15/23 0810  BP: 93/77 120/63  Pulse: 77 76  Resp: 18 19  Temp: 36.7 C 36.6 C  SpO2: (!) 87% 91%    Last Pain:  Vitals:   12/15/23 0810  TempSrc:   PainSc: 0-No pain                 VAN STAVEREN,Pollyanna Levay

## 2023-12-16 ENCOUNTER — Inpatient Hospital Stay: Payer: Medicare HMO

## 2023-12-16 DIAGNOSIS — K449 Diaphragmatic hernia without obstruction or gangrene: Secondary | ICD-10-CM | POA: Diagnosis not present

## 2023-12-16 DIAGNOSIS — K3189 Other diseases of stomach and duodenum: Secondary | ICD-10-CM | POA: Diagnosis not present

## 2023-12-16 DIAGNOSIS — K311 Adult hypertrophic pyloric stenosis: Secondary | ICD-10-CM | POA: Diagnosis not present

## 2023-12-16 LAB — BASIC METABOLIC PANEL
Anion gap: 9 (ref 5–15)
BUN: 29 mg/dL — ABNORMAL HIGH (ref 8–23)
CO2: 25 mmol/L (ref 22–32)
Calcium: 8.3 mg/dL — ABNORMAL LOW (ref 8.9–10.3)
Chloride: 104 mmol/L (ref 98–111)
Creatinine, Ser: 0.8 mg/dL (ref 0.44–1.00)
GFR, Estimated: >60 mL/min (ref >60)
Glucose, Bld: 99 mg/dL (ref 70–99)
Potassium: 4 mmol/L (ref 3.5–5.1)
Sodium: 138 mmol/L (ref 135–145)

## 2023-12-16 LAB — CBC
HCT: 32.3 % — ABNORMAL LOW (ref 36.0–46.0)
Hemoglobin: 10.7 g/dL — ABNORMAL LOW (ref 12.0–15.0)
MCH: 32.9 pg (ref 26.0–34.0)
MCHC: 33.1 g/dL (ref 30.0–36.0)
MCV: 99.4 fL (ref 80.0–100.0)
Platelets: 212 10*3/uL (ref 150–400)
RBC: 3.25 MIL/uL — ABNORMAL LOW (ref 3.87–5.11)
RDW: 13.9 % (ref 11.5–15.5)
WBC: 14.6 10*3/uL — ABNORMAL HIGH (ref 4.0–10.5)
nRBC: 0 % (ref 0.0–0.2)

## 2023-12-16 LAB — MAGNESIUM: Magnesium: 2.1 mg/dL (ref 1.7–2.4)

## 2023-12-16 MED ORDER — ACETAMINOPHEN 10 MG/ML IV SOLN
1000.0000 mg | Freq: Four times a day (QID) | INTRAVENOUS | Status: AC
  Administered 2023-12-16 – 2023-12-17 (×3): 1000 mg via INTRAVENOUS
  Filled 2023-12-16 (×4): qty 100

## 2023-12-16 MED ORDER — CHLORHEXIDINE GLUCONATE CLOTH 2 % EX PADS: 6.0000 | MEDICATED_PAD | Freq: Every day | CUTANEOUS | Status: DC

## 2023-12-16 NOTE — Progress Notes (Signed)
  Progress Note   Patient: Angela Bass VHQ:[id number] DOB: 1941/11/22 DOA: 12/13/2023     3 DOS: the patient was seen and examined on 12/16/2023   Brief hospital course: Angela Bass is a 82 y.o. female with medical history significant of HTN, hiatal hernia, morbid obesity, presented with new onset of worsening vomiting abdominal pain. CT abdomen pelvis showed markedly dilated and fluid-filled stomach with rotation of the stomach along the long axis acute gastric volvulus.  EGD was performed on 12/26, could not pass gastric body due to hiatal hernia.  Hernia repair was performed on 12/27.   Principal Problem:   Gastric volvulus Active Problems:   Acute gastric volvulus   Partial gastric outlet obstruction   Paraesophageal hernia   Hypokalemia   Hiatal hernia with obstruction but no gangrene   Assessment and Plan: Gastric outlet obstruction. Giant hiatal hernia. Gastric volvulus. Patient is status post hernia repair today, doing well.   Followed and managed by general surgery.  Will continue fluids as patient still NPO.   Essential hypertension. Continue prn blood pressure medicine.   Morbid obesity. Continue to follow.   Hypokalemia. Recheck level tomorrow.       Subjective:  Patient doing well today, no complaint.  No abdominal pain.  Physical Exam: Vitals:   12/15/23 1943 12/16/23 0320 12/16/23 0326 12/16/23 0803  BP: (!) 123/55  (!) 126/103 (!) 118/48  Pulse: 86  81 73  Resp: 16  20   Temp: 97.8 F (36.6 C)  98.4 F (36.9 C) 97.7 F (36.5 C)  TempSrc: Oral  Oral Oral  SpO2: 95% (!) 89% 97% 100%  Weight:      Height:       General exam: Appears calm and comfortable  Respiratory system: Clear to auscultation. Respiratory effort normal. Cardiovascular system: S1 & S2 heard, RRR. No JVD, murmurs, rubs, gallops or clicks. No pedal edema. Gastrointestinal system: Abdomen is nondistended, soft and nontender. No organomegaly or masses felt. Normal bowel  sounds heard. Central nervous system: Alert and oriented. No focal neurological deficits. Extremities: Symmetric 5 x 5 power. Skin: No rashes, lesions or ulcers Psychiatry: Judgement and insight appear normal. Mood & affect appropriate.    Data Reviewed:  Lab results reviewed.  Family Communication: Daughter updated at bedside.  Disposition: Status is: Inpatient Remains inpatient appropriate because: Severity of disease, postop.     Time spent: 35 minutes  Author: Marrion Coy, MD 12/16/2023 1:37 PM  For on call review [email or web address].

## 2023-12-16 NOTE — Plan of Care (Signed)
   Problem: Coping: Goal: Level of anxiety will decrease Outcome: Progressing   Problem: Pain Management: Goal: General experience of comfort will improve Outcome: Progressing   Problem: Skin Integrity: Goal: Risk for impaired skin integrity will decrease Outcome: Progressing

## 2023-12-16 NOTE — Plan of Care (Signed)

## 2023-12-16 NOTE — Progress Notes (Signed)
Foley removed. Patient ambulated with RN to bathroom. Patient steady and tolerated ambulating well. Patient able to perform oral hygiene and peri care in bathroom. Patient returned back to bed.   Madie Reno, RN

## 2023-12-16 NOTE — Progress Notes (Signed)
CC: S/p paraesophageal hernia repair. Subjective: Patient is postop day 1 from paraesophageal hernia repair.  She looks remarkably well this morning.  She denies having any pain.  She is not passing any flatus and she has an NG tube in place.  Objective: Vital signs in last 24 hours: Temp:  [97.6 F (36.4 C)-98.4 F (36.9 C)] 97.7 F (36.5 C) (12/28 0803) Pulse Rate:  [73-90] 73 (12/28 0803) Resp:  [14-20] 20 (12/28 0326) BP: (115-130)/(48-103) 118/48 (12/28 0803) SpO2:  [89 %-100 %] 100 % (12/28 0803) Last BM Date : 12/11/23  Intake/Output from previous day: 12/27 0701 - 12/28 0700 In: 2234.6 [I.V.:1604.6; NG/GT:30; IV Piggyback:600] Out: 660 [Urine:400; Emesis/NG output:160; Blood:100] Intake/Output this shift: Total I/O In: 599.8 [I.V.:599.8] Out: -   Physical exam:  Abdomen is soft, appropriately tender over the incisions.  She does have some bruising around the area where they are giving her DVT prophylaxis.  Otherwise the incisions are healing well without any erythema or drainage.  He has an NG tube in place that is putting out gastric appearing contents.  It is put out 160s since surgery yesterday  Lab Results: CBC  Recent Labs    12/14/23 0544 12/16/23 0520  WBC 9.2 14.6*  HGB 11.4* 10.7*  HCT 34.1* 32.3*  PLT 225 212   BMET Recent Labs    12/15/23 0524 12/16/23 0520  NA 140 138  K 4.7 4.0  CL 102 104  CO2 27 25  GLUCOSE 95 99  BUN 19 29*  CREATININE 0.72 0.80  CALCIUM 8.9 8.3*   PT/INR No results for input(s): "LABPROT", "INR" in the last 72 hours. ABG No results for input(s): "PHART", "HCO3" in the last 72 hours.  Invalid input(s): "PCO2", "PO2"  Studies/Results: DG ABD ACUTE 2+V W 1V CHEST Result Date: 12/16/2023 CLINICAL DATA:  Postop repair paraesophageal hernia. EXAM: DG ABDOMEN ACUTE WITH 1 VIEW CHEST COMPARISON:  12/14/2023. FINDINGS: Bibasilar pulmonary parenchymal consolidation. Small left-sided pleural effusion. Calcified aorta. NG  tube tip superimposed with the stomach. Unremarkable bowel gas pattern. No free air. No organomegaly. No radiopaque stones. IMPRESSION: 1. Bibasilar consolidation and small left-sided effusion. 2. NG tube in place. 3. No acute abdominal pathology. Electronically Signed   By: Layla Maw M.D.   On: 12/16/2023 10:41    Anti-infectives: Anti-infectives (From admission, onward)    Start     Dose/Rate Route Frequency Ordered Stop   12/15/23 1530  ceFAZolin (ANCEF) IVPB 2g/100 mL premix        2 g 200 mL/hr over 30 Minutes Intravenous Every 8 hours 12/15/23 1332 12/16/23 0943   12/14/23 1100  ceFAZolin (ANCEF) IVPB 2g/100 mL premix        2 g 200 mL/hr over 30 Minutes Intravenous  Once 12/14/23 1010 12/15/23 1000       Assessment/Plan:  82 year old lady status post robotic assisted paraesophageal hernia repair.  She is doing well this morning.  The case was made difficult by the large amount of stomach that was in the mediastinum.  I discussed with the patient and their family that we will plan for keeping her n.p.o. over the weekend and then performing a barium swallow on Monday.  I reviewed her KUB this morning and it is no evidence of acute recurrence of the hernia and the NG tube looks to be in appropriate position within the stomach.  From surgical standpoint okay to DC Foley but will defer to the primary team for this.  She needs to  get up and ambulate.  I-S.  Keep n.p.o. with NG tube to low intermittent wall suction  Baker Pierini, M.D. [facility]

## 2023-12-17 DIAGNOSIS — K3189 Other diseases of stomach and duodenum: Secondary | ICD-10-CM | POA: Diagnosis not present

## 2023-12-17 DIAGNOSIS — K311 Adult hypertrophic pyloric stenosis: Secondary | ICD-10-CM | POA: Diagnosis not present

## 2023-12-17 DIAGNOSIS — K440 Diaphragmatic hernia with obstruction, without gangrene: Secondary | ICD-10-CM | POA: Diagnosis not present

## 2023-12-17 LAB — BASIC METABOLIC PANEL
Anion gap: 10 (ref 5–15)
BUN: 28 mg/dL — ABNORMAL HIGH (ref 8–23)
CO2: 25 mmol/L (ref 22–32)
Calcium: 8.3 mg/dL — ABNORMAL LOW (ref 8.9–10.3)
Chloride: 106 mmol/L (ref 98–111)
Creatinine, Ser: 0.75 mg/dL (ref 0.44–1.00)
GFR, Estimated: >60 mL/min (ref >60)
Glucose, Bld: 81 mg/dL (ref 70–99)
Potassium: 3.8 mmol/L (ref 3.5–5.1)
Sodium: 141 mmol/L (ref 135–145)

## 2023-12-17 LAB — MAGNESIUM: Magnesium: 2.2 mg/dL (ref 1.7–2.4)

## 2023-12-17 LAB — PROCALCITONIN: Procalcitonin: 0.17 ng/mL

## 2023-12-17 MED ORDER — ACETAMINOPHEN 10 MG/ML IV SOLN
1000.0000 mg | Freq: Four times a day (QID) | INTRAVENOUS | Status: AC
  Administered 2023-12-17 – 2023-12-18 (×4): 1000 mg via INTRAVENOUS
  Filled 2023-12-17 (×4): qty 100

## 2023-12-17 MED ORDER — ALBUMIN HUMAN 25 % IV SOLN
25.0000 g | Freq: Once | INTRAVENOUS | Status: AC
  Administered 2023-12-17: 25 g via INTRAVENOUS
  Filled 2023-12-17: qty 100

## 2023-12-17 MED ORDER — KCL-LACTATED RINGERS-D5W 20 MEQ/L IV SOLN
INTRAVENOUS | Status: DC
Start: 2023-12-17 — End: 2023-12-18
  Filled 2023-12-17 (×2): qty 1000

## 2023-12-17 NOTE — Evaluation (Signed)
Physical Therapy Evaluation Patient Details Name: Angela Bass MRN: [id number] DOB: [birth year] Today's Date: 12/17/2023  History of Present Illness  Pt is an 82 y/o F admitted on 12/13/23 after presenting with c/o new onset of worsening vomiting & abdominal pain. Pt is being treated for gastric volvulus, giant hiatal hernia, gastric outlet obstruction. Pt underwent hernia repair on 12/27. PMH: HTN,  hiatal hernia, morbid obesity  Clinical Impression  Pt seen for PT evaluation with pt agreeable, daughter present during session. Pt reports prior to admission she was ambulatory with SPC 2/2 ongoing abdominal pain, living with adult daughters in a 1 level home with 3 steps with B rails to enter. On this date, pt is eager to attempt ambulation without AD & is able to ambulate with HHA fade to no AD with as little as supervision. Overall gait distance limited by fatigue but pt & daughter both report pt's gait pattern is much improved compared to baseline. Pt's SpO2 does drop to 88% after gait but quickly increases to >/= 90% with rest; educated pt on pursed lip breathing & reviewed use of incentive spirometer. Will continue to follow pt acutely to progress gait & stair negotiation with LRAD but anticipate pt will progress well as she continues to mobilize.      If plan is discharge home, recommend the following: A little help with walking and/or transfers;A little help with bathing/dressing/bathroom;Assist for transportation;Assistance with cooking/housework;Help with stairs or ramp for entrance   Can travel by private vehicle        Equipment Recommendations None recommended by PT  Recommendations for Other Services       Functional Status Assessment Patient has had a recent decline in their functional status and demonstrates the ability to make significant improvements in function in a reasonable and predictable amount of time.     Precautions / Restrictions Precautions Precautions:  None Precaution Comments: log roll for comfort Restrictions Weight Bearing Restrictions Per Provider Order: No      Mobility  Bed Mobility Overal bed mobility: Needs Assistance, Modified Independent Bed Mobility: Supine to Sit     Supine to sit: Modified independent (Device/Increase time), HOB elevated, Used rails     General bed mobility comments: PT educates pt on log rolling to decrease discomfort with movement & pt able to complete with mod I.    Transfers Overall transfer level: Needs assistance Equipment used: None Transfers: Sit to/from Stand Sit to Stand: Contact guard assist (daughter electing to provide assistance.)                Ambulation/Gait Ambulation/Gait assistance: Contact guard assist, Supervision Gait Distance (Feet): 100 Feet Assistive device: 1 person hand held assist, None Gait Pattern/deviations: Antalgic, Decreased step length - right, Decreased step length - left, Decreased stride length Gait velocity: decreased     General Gait Details: Pt eager to ambulate without AD, only briefly holding PT's hand. Pt with increased weight shift to LLE during stance phase, daughter reports less of a "limp" compared to baseline, pt reports overall gait is much improved compared to baseline.  Stairs            Wheelchair Mobility     Tilt Bed    Modified Rankin (Stroke Patients Only)       Balance Overall balance assessment: Needs assistance   Sitting balance-Leahy Scale: Good     Standing balance support: During functional activity, No upper extremity supported Standing balance-Leahy Scale: Fair  Pertinent Vitals/Pain Pain Assessment Pain Assessment: No/denies pain    Home Living Family/patient expects to be discharged to:: Private residence Living Arrangements: Children (adult daughters) Available Help at Discharge: Family Type of Home: House Home Access: Stairs to enter Entrance  Stairs-Rails: Lawyer of Steps: 3   Home Layout: One level Home Equipment: Gilmer Mor - single point      Prior Function Prior Level of Function : Independent/Modified Independent             Mobility Comments: Independent, started using SPC since decline ~6 months ago 2/2 abdominal pain. Denies falls. ADLs Comments: Independent.     Extremity/Trunk Assessment   Upper Extremity Assessment Upper Extremity Assessment: Overall WFL for tasks assessed    Lower Extremity Assessment Lower Extremity Assessment: Generalized weakness;Overall WFL for tasks assessed       Communication   Communication Communication: No apparent difficulties  Cognition Arousal: Alert Behavior During Therapy: WFL for tasks assessed/performed Overall Cognitive Status: Within Functional Limits for tasks assessed                                 General Comments: very pleasant, motivated to participate        General Comments      Exercises     Assessment/Plan    PT Assessment Patient needs continued PT services  PT Problem List Decreased strength;Cardiopulmonary status limiting activity;Decreased activity tolerance;Decreased balance;Decreased mobility       PT Treatment Interventions Balance training;DME instruction;Gait training;Neuromuscular re-education;Stair training;Functional mobility training;Therapeutic activities;Therapeutic exercise;Manual techniques;Patient/family education    PT Goals (Current goals can be found in the Care Plan section)  Acute Rehab PT Goals Patient Stated Goal: get better PT Goal Formulation: With patient Time For Goal Achievement: 12/31/23 Potential to Achieve Goals: Good    Frequency Min 1X/week     Co-evaluation               AM-PAC PT "6 Clicks" Mobility  Outcome Measure Help needed turning from your back to your side while in a flat bed without using bedrails?: None Help needed moving from lying on your  back to sitting on the side of a flat bed without using bedrails?: A Little Help needed moving to and from a bed to a chair (including a wheelchair)?: A Little Help needed standing up from a chair using your arms (e.g., wheelchair or bedside chair)?: A Little Help needed to walk in hospital room?: A Little Help needed climbing 3-5 steps with a railing? : A Little 6 Click Score: 19    End of Session   Activity Tolerance: Patient tolerated treatment well;Patient limited by fatigue Patient left: in chair;with call bell/phone within reach;with family/visitor present   PT Visit Diagnosis: Muscle weakness (generalized) (M62.81);Unsteadiness on feet (R26.81)    Time: 4034-7425 PT Time Calculation (min) (ACUTE ONLY): 15 min   Charges:   PT Evaluation $PT Eval Moderate Complexity: 1 Mod   PT General Charges $$ ACUTE PT VISIT: 1 Visit         Aleda Grana, PT, DPT 12/17/23, 12:02 PM   Sandi Mariscal 12/17/2023, 12:00 PM

## 2023-12-17 NOTE — Progress Notes (Signed)
°  Progress Note   Patient: Angela Bass OZH:[id number] DOB: February 09, 1941 DOA: 12/13/2023     4 DOS: the patient was seen and examined on 12/17/2023   Brief hospital course: ALYSEA CREASY is a 82 y.o. female with medical history significant of HTN, hiatal hernia, morbid obesity, presented with new onset of worsening vomiting abdominal pain. CT abdomen pelvis showed markedly dilated and fluid-filled stomach with rotation of the stomach along the long axis acute gastric volvulus.  EGD was performed on 12/26, could not pass gastric body due to hiatal hernia.  Hernia repair was performed on 12/27.   Principal Problem:   Gastric volvulus Active Problems:   Acute gastric volvulus   Partial gastric outlet obstruction   Paraesophageal hernia   Hypokalemia   Hiatal hernia with obstruction but no gangrene   Assessment and Plan: Gastric outlet obstruction. Giant hiatal hernia. Gastric volvulus. Patient is status post hernia repair today, doing well.   Followed and managed by general surgery.   Repeated KUB today showed bilateral lower lobe infiltrates, I will check procalcitonin level, was 0.17, not consistent with bacterial pneumonia.  Patient probably has atelectasis, start incentive spirometer.   Essential hypertension. Continue prn blood pressure medicine.   Morbid obesity. Continue to follow.   Hypokalemia. Improved.        Subjective:  Patient doing well, passed gas, no bowel movement.  No abdominal pain.  Physical Exam: Vitals:   12/16/23 0803 12/16/23 1604 12/17/23 0417 12/17/23 0741  BP: (!) 118/48 (!) 90/52 (!) 129/56 (!) 150/52  Pulse: 73 74 74 75  Resp:   (!) 24   Temp: 97.7 F (36.5 C) 98 F (36.7 C) 98.1 F (36.7 C) 97.9 F (36.6 C)  TempSrc: Oral Oral Oral Oral  SpO2: 100% 95% 98% 91%  Weight:      Height:       General exam: Appears calm and comfortable  Respiratory system: Clear to auscultation. Respiratory effort normal. Cardiovascular system: S1  & S2 heard, RRR. No JVD, murmurs, rubs, gallops or clicks. No pedal edema. Gastrointestinal system: Abdomen is nondistended, soft and nontender. No organomegaly or masses felt. Normal bowel sounds heard. Central nervous system: Alert and oriented. No focal neurological deficits. Extremities: Symmetric 5 x 5 power. Skin: No rashes, lesions or ulcers Psychiatry: Judgement and insight appear normal. Mood & affect appropriate. '  Data Reviewed:  Lab results reviewed.  Family Communication: Daughter updated at bedside.  Disposition: Status is: Inpatient Remains inpatient appropriate because: Severity of disease, IV treatment.     Time spent: 35 minutes  Author: Marrion Coy, MD 12/17/2023 12:30 PM  For on call review [email or web address].

## 2023-12-17 NOTE — Progress Notes (Signed)
POD # 2 Doing great considering Minimal abd pain Ambulating  PE NAD ABd: soft, incision c/d/I  A/P Doing well Gastrografin study tomorrow, if negative advance diet and DC home

## 2023-12-18 ENCOUNTER — Inpatient Hospital Stay: Payer: Medicare HMO

## 2023-12-18 ENCOUNTER — Encounter: Payer: Self-pay | Admitting: Surgery

## 2023-12-18 DIAGNOSIS — K3189 Other diseases of stomach and duodenum: Secondary | ICD-10-CM | POA: Diagnosis not present

## 2023-12-18 DIAGNOSIS — K311 Adult hypertrophic pyloric stenosis: Secondary | ICD-10-CM | POA: Diagnosis not present

## 2023-12-18 DIAGNOSIS — K440 Diaphragmatic hernia with obstruction, without gangrene: Secondary | ICD-10-CM | POA: Diagnosis not present

## 2023-12-18 LAB — CBC
HCT: 29.2 % — ABNORMAL LOW (ref 36.0–46.0)
Hemoglobin: 9.7 g/dL — ABNORMAL LOW (ref 12.0–15.0)
MCH: 32.7 pg (ref 26.0–34.0)
MCHC: 33.2 g/dL (ref 30.0–36.0)
MCV: 98.3 fL (ref 80.0–100.0)
Platelets: 175 10*3/uL (ref 150–400)
RBC: 2.97 MIL/uL — ABNORMAL LOW (ref 3.87–5.11)
RDW: 14 % (ref 11.5–15.5)
WBC: 8.1 10*3/uL (ref 4.0–10.5)
nRBC: 0 % (ref 0.0–0.2)

## 2023-12-18 LAB — BASIC METABOLIC PANEL
Anion gap: 8 (ref 5–15)
BUN: 15 mg/dL (ref 8–23)
CO2: 26 mmol/L (ref 22–32)
Calcium: 7.9 mg/dL — ABNORMAL LOW (ref 8.9–10.3)
Chloride: 102 mmol/L (ref 98–111)
Creatinine, Ser: 0.53 mg/dL (ref 0.44–1.00)
GFR, Estimated: >60 mL/min (ref >60)
Glucose, Bld: 119 mg/dL — ABNORMAL HIGH (ref 70–99)
Potassium: 3.6 mmol/L (ref 3.5–5.1)
Sodium: 136 mmol/L (ref 135–145)

## 2023-12-18 LAB — MAGNESIUM: Magnesium: 2.1 mg/dL (ref 1.7–2.4)

## 2023-12-18 MED ORDER — OXYCODONE HCL 5 MG PO TABS: 5.0000 mg | ORAL_TABLET | ORAL | Status: DC | PRN

## 2023-12-18 MED ORDER — IOHEXOL 300 MG/ML  SOLN
100.0000 mL | Freq: Once | INTRAMUSCULAR | Status: AC | PRN
  Administered 2023-12-18: 100 mL via ORAL

## 2023-12-18 NOTE — Plan of Care (Signed)

## 2023-12-18 NOTE — Discharge Summary (Signed)
Physician Discharge Summary   Patient: Angela Bass MRN: [id number] DOB: October 27, 1941  Admit date:     12/13/2023  Discharge date: 12/18/23  Discharge Physician: Marrion Coy   PCP: Jerl Mina, MD   Recommendations at discharge:   Follow with PCP in 1 week. Follow-up with general surgery as scheduled.  Discharge Diagnoses: Principal Problem:   Gastric volvulus Active Problems:   Acute gastric volvulus   Partial gastric outlet obstruction   Paraesophageal hernia   Hypokalemia   Hiatal hernia with obstruction but no gangrene  Resolved Problems:   * No resolved hospital problems. *  Hospital Course: Angela Bass is a 82 y.o. female with medical history significant of HTN, hiatal hernia, morbid obesity, presented with new onset of worsening vomiting abdominal pain. CT abdomen pelvis showed markedly dilated and fluid-filled stomach with rotation of the stomach along the long axis acute gastric volvulus.  EGD was performed on 12/26, could not pass gastric body due to hiatal hernia.  Hernia repair was performed on 12/27. Patient condition had improved, tolerating diet.  Barium swallow did not show any esophageal leak.  Discussed with general surgery, patient can discharge today.   Assessment and Plan: Gastric outlet obstruction. Giant hiatal hernia. Gastric volvulus. Patient is status post hernia repair today, doing well.   Followed and managed by general surgery.   Repeated KUB today showed bilateral lower lobe infiltrates, I will check procalcitonin level, was 0.17, not consistent with bacterial pneumonia.  Patient probably has atelectasis, started incentive spirometer. Patient condition improved, barium esophagram did not show any leak.  General surgery cleared patient for discharge.   Essential hypertension. Pressure not significant elevated.   Morbid obesity. Diet and excise   Hypokalemia. Improved.        Consultants: General surgery Procedures  performed: Hiatal hernia repair. Disposition: Home Diet recommendation:  Discharge Diet Orders (From admission, onward)     Start     Ordered   12/18/23 0000  Diet - low sodium heart healthy        12/18/23 1219           Cardiac diet DISCHARGE MEDICATION: Allergies as of 12/18/2023   No Known Allergies      Medication List     STOP taking these medications    furosemide 20 MG tablet Commonly known as: LASIX   ivabradine 7.5 MG Tabs tablet Commonly known as: Corlanor   metoprolol tartrate 100 MG tablet Commonly known as: LOPRESSOR       TAKE these medications    doxycycline 100 MG tablet Commonly known as: VIBRA-TABS Take 100 mg by mouth daily.   simvastatin 20 MG tablet Commonly known as: ZOCOR Take 1 tablet by mouth daily.        Follow-up Information     Leafy Ro, MD. Go on 01/03/2024.   Specialty: General Surgery Why: Go to appointment on 01/15 at 1030 AM Contact information: [address] [place] [address] [phone]         Jerl Mina, MD Follow up in 1 week(s).   Specialty: Family Medicine Contact information: [address] [place] [address] [phone]                Discharge Exam: Ceasar Mons Weights   12/13/23 1200 12/14/23 1202  Weight: 83.9 kg 83.9 kg   General exam: Appears calm and comfortable  Respiratory system: Clear to auscultation. Respiratory effort normal. Cardiovascular system: S1 & S2 heard,  RRR. No JVD, murmurs, rubs, gallops or clicks. No pedal edema. Gastrointestinal system: Abdomen is nondistended, soft and nontender. No organomegaly or masses felt. Normal bowel sounds heard. Central nervous system: Alert and oriented. No focal neurological deficits. Extremities: Symmetric 5 x 5 power. Skin: No rashes, lesions or ulcers Psychiatry: Judgement and insight appear normal. Mood & affect appropriate.    Condition at discharge: good  The results of  significant diagnostics from this hospitalization (including imaging, microbiology, ancillary and laboratory) are listed below for reference.   Imaging Studies: DG ESOPHAGUS W SINGLE CM (SOL OR THIN BA) Result Date: 12/18/2023 CLINICAL DATA:  Status post paraesophageal hernia repair with mesh on 12/15/2023. Request for esophagram to rule out postoperative esophageal leak. EXAM: ESOPHAGUS/BARIUM SWALLOW/TABLET STUDY TECHNIQUE: Single contrast examination was performed using water-soluble contrast. This exam was performed by Anders Grant NP and was supervised and interpreted by Dr. Sebastian Ache. FLUOROSCOPY: Radiation Exposure Index (as provided by the fluoroscopic device): 27.90 mGy Kerma COMPARISON:  None Available. FINDINGS: The patient drank contrast readily, and contrast passed from the esophagus into the stomach without delay or evidence of a leak. An enteric tube terminates in the stomach. There is mild smooth narrowing at the level of the GE junction. IMPRESSION: No evidence of esophageal leak status post paraesophageal hernia repair. Electronically Signed   By: Sebastian Ache M.D.   On: 12/18/2023 10:42   DG ABD ACUTE 2+V W 1V CHEST Result Date: 12/16/2023 CLINICAL DATA:  Postop repair paraesophageal hernia. EXAM: DG ABDOMEN ACUTE WITH 1 VIEW CHEST COMPARISON:  12/14/2023. FINDINGS: Bibasilar pulmonary parenchymal consolidation. Small left-sided pleural effusion. Calcified aorta. NG tube tip superimposed with the stomach. Unremarkable bowel gas pattern. No free air. No organomegaly. No radiopaque stones. IMPRESSION: 1. Bibasilar consolidation and small left-sided effusion. 2. NG tube in place. 3. No acute abdominal pathology. Electronically Signed   By: Layla Maw M.D.   On: 12/16/2023 10:41   DG ABD ACUTE 2+V W 1V CHEST Result Date: 12/14/2023 CLINICAL DATA:  [id number] with gastric outlet obstruction. EXAM: DG ABDOMEN ACUTE WITH 1 VIEW CHEST COMPARISON:  CT abdomen pelvis 12/13/2023  FINDINGS: There is no evidence of dilated bowel loops or free intraperitoneal air. No urinary calculi or other significant radiographic abnormality is seen. The CT demonstrated a large hiatal hernia eccentric to the right containing the gastric antrum and gastroduodenal junction with the fundus fluid dilated located in its normal position below the diaphragm. NGT has been inserted extends below the diaphragm and is coiled in the gastric fundus. Clustered on the adjusted densities in the ascending colon are again noted possibly undigested medication with additional similar clustered densities in the fundus of the stomach. There are multiple pelvic phleboliths. There is advanced hip DJD on the left-greater-than-right, with left acetabular protrusio. Heart size and mediastinal contours are within normal limits except as above. Both lungs are clear. IMPRESSION: 1. No evidence of bowel obstruction or free air. 2. NGT coiled in the gastric fundus. 3. Large hiatal hernia eccentric to the right containing the gastric antrum and gastroduodenal junction on CT, with the fundus fluid-dilated located in its normal position below the diaphragm. 4. Clustered densities in the ascending colon and fundus of the stomach possibly undigested medication. 5. No acute chest findings. Electronically Signed   By: Almira Bar M.D.   On: 12/14/2023 07:42   DG Abdomen 1 View Result Date: 12/13/2023 CLINICAL DATA:  NG placement. EXAM: ABDOMEN - 1 VIEW COMPARISON:  CT abdomen pelvis dated 12/13/2023. FINDINGS:  Enteric tube with tip in the left upper abdomen over the gastric air. Bibasilar pulmonary opacities, left greater than right. IMPRESSION: Enteric tube with tip in the stomach. Electronically Signed   By: Elgie Collard M.D.   On: 12/13/2023 16:14   CT ABDOMEN PELVIS W CONTRAST Result Date: 12/13/2023 CLINICAL DATA:  Bowel obstruction suspected.  Vomiting. EXAM: CT ABDOMEN AND PELVIS WITH CONTRAST TECHNIQUE: Multidetector CT  imaging of the abdomen and pelvis was performed using the standard protocol following bolus administration of intravenous contrast. RADIATION DOSE REDUCTION: This exam was performed according to the departmental dose-optimization program which includes automated exposure control, adjustment of the mA and/or kV according to patient size and/or use of iterative reconstruction technique. CONTRAST:  OMNIPAQUE IOHEXOL 300 MG/ML  SOLN COMPARISON:  CT scan thoracic spine from 09/19/2022. FINDINGS: Lower chest: There are patchy the atelectatic changes in the visualized lung bases. No overt consolidation. No pleural effusion. The heart is normal in size. No pericardial effusion. Hepatobiliary: The liver is normal in size. Non-cirrhotic configuration. No suspicious mass. No intrahepatic or extrahepatic bile duct dilation. No calcified gallstones. Normal gallbladder wall thickness. No pericholecystic inflammatory changes. Pancreas: Unremarkable. No pancreatic ductal dilatation or surrounding inflammatory changes. Spleen: Within normal limits. No focal lesion. Adrenals/Urinary Tract: Adrenal glands are unremarkable. No suspicious renal mass. No hydronephrosis. No renal or ureteric calculi. Urinary bladder is under distended, precluding optimal assessment. However, no large mass or stones identified. No perivesical fat stranding. Stomach/Bowel: There is markedly dilated and fluid-filled stomach. There is ingested food material and at least 4 medication tablets in the dependent portion of the stomach. Redemonstration of paraesophageal hernia. However, there is rotation of the stomach along its long axis with resultant reversal of the greater and lesser curvatures. The antrum is rotated anteriorly and superiorly while fundus is rotated posteriorly and inferiorly. However, there is no abnormal gastric wall thickening or surrounding fat stranding. The entire stomach is not imaged on the prior thoracic spine or cardiac CT scan  however, configuration of upper portion of the stomach appears grossly similar. No disproportionate dilation of the small or large bowel loops. No evidence of abnormal bowel wall thickening or inflammatory changes. The appendix was not visualized; however there is no acute inflammatory process in the right lower quadrant. There are multiple diverticula mainly in the left hemi colon, without imaging signs of diverticulitis. Vascular/Lymphatic: No ascites or pneumoperitoneum. No abdominal or pelvic lymphadenopathy, by size criteria. No aneurysmal dilation of the major abdominal arteries. There are mild peripheral atherosclerotic vascular calcifications of the aorta and its major branches. Reproductive: The uterus is surgically absent. No large adnexal mass. Other: Periumbilical surgical scar and tiny fat containing hernia noted. The soft tissues and abdominal wall are otherwise unremarkable. Musculoskeletal: No suspicious osseous lesions. There are mild - moderate multilevel degenerative changes in the visualized spine. IMPRESSION: *Markedly dilated and fluid-filled stomach with rotation of stomach along its long axis, as described in detail above, concerning for organo-axial volvulus. Correlate clinically. *Multiple other nonacute observations, as described above. Electronically Signed   By: Jules Schick M.D.   On: 12/13/2023 14:13    Microbiology: Results for orders placed or performed during the hospital encounter of 12/13/23  Urine Culture     Status: Abnormal   Collection Time: 12/13/23 12:36 PM   Specimen: Urine, Clean Catch  Result Value Ref Range Status   Specimen Description   Final    URINE, CLEAN CATCH Performed at [facility], [address]  [place], [place] [address]    Special Requests   Final    NONE Performed at [facility], [address] [place], [place] [address]    Culture 70,000 COLONIES/mL ESCHERICHIA COLI (A)  Final   Report Status 12/15/2023 FINAL   Final   Organism ID, Bacteria ESCHERICHIA COLI (A)  Final      Susceptibility   Escherichia coli - MIC*    AMPICILLIN >=32 RESISTANT Resistant     CEFAZOLIN <=4 SENSITIVE Sensitive     CEFEPIME <=0.12 SENSITIVE Sensitive     CEFTRIAXONE <=0.25 SENSITIVE Sensitive     CIPROFLOXACIN 2 RESISTANT Resistant     GENTAMICIN <=1 SENSITIVE Sensitive     IMIPENEM <=0.25 SENSITIVE Sensitive     NITROFURANTOIN 64 INTERMEDIATE Intermediate     TRIMETH/SULFA <=20 SENSITIVE Sensitive     AMPICILLIN/SULBACTAM >=32 RESISTANT Resistant     PIP/TAZO 8 SENSITIVE Sensitive ug/mL    * 70,000 COLONIES/mL ESCHERICHIA COLI    Labs: CBC: Recent Labs  Lab 12/13/23 1236 12/14/23 0544 12/16/23 0520 12/18/23 0424  WBC 11.6* 9.2 14.6* 8.1  HGB 13.0 11.4* 10.7* 9.7*  HCT 37.6 34.1* 32.3* 29.2*  MCV 94.7 97.2 99.4 98.3  PLT 264 225 212 175   Basic Metabolic Panel: Recent Labs  Lab 12/14/23 0544 12/15/23 0524 12/16/23 0520 12/17/23 0449 12/18/23 0424  NA 138 140 138 141 136  K 3.2* 4.7 4.0 3.8 3.6  CL 100 102 104 106 102  CO2 28 27 25 25 26   GLUCOSE 106* 95 99 81 119*  BUN 11 19 29* 28* 15  CREATININE 0.82 0.72 0.80 0.75 0.53  CALCIUM 8.3* 8.9 8.3* 8.3* 7.9*  MG  --  2.1 2.1 2.2 2.1   Liver Function Tests: Recent Labs  Lab 12/13/23 1202  AST 27  ALT 14  ALKPHOS 65  BILITOT 1.4*  PROT 7.3  ALBUMIN 4.0   CBG: Recent Labs  Lab 12/15/23 1221  GLUCAP 129*    Discharge time spent: greater than 30 minutes.  Signed: Marrion Coy, MD [facility] 12/18/2023

## 2023-12-18 NOTE — Discharge Instructions (Signed)
In addition to included general post-operative instructions,  Diet: Recommend following [place]issen Diet restrictions (hand out provided) for at least 4 weeks  Activity: No heavy lifting >20 pounds (children, pets, laundry, garbage) or strenuous activity for 4 weeks from date of surgery, but light activity and walking are encouraged. Do not drive or drink alcohol if taking narcotic pain medications or having pain that might distract from driving.  Wound care: You may shower/get incision wet with soapy water and pat dry (do not rub incisions), but no baths or submerging incision underwater until follow-up.   Medications: Resume all home medications. For mild to moderate pain: acetaminophen (Tylenol) or ibuprofen/naproxen (if no kidney disease). Combining Tylenol with alcohol can substantially increase your risk of causing liver disease. Narcotic pain medications, if prescribed, can be used for severe pain, though may cause nausea, constipation, and drowsiness. Do not combine Tylenol and Percocet (or similar) within a 6 hour period as Percocet (and similar) contain(s) Tylenol. If you do not need the narcotic pain medication, you do not need to fill the prescription.  Call office [phone] / [phone]) at any time if any questions, worsening pain, fevers/chills, bleeding, drainage from incision site, or other concerns.

## 2023-12-18 NOTE — Care Management Important Message (Signed)
Important Message  Patient Details  Name: Angela Bass MRN: [id number] Date of Birth: August 13, 1941   Important Message Given:  Yes - Medicare IM     Cristela Blue, CMA 12/18/2023, 3:26 PM

## 2023-12-18 NOTE — Progress Notes (Signed)
[facility] SURGICAL PROGRESS NOTE  Hospital Day(s): 5.   Post op day(s): 3 Days Post-Op.   Interval History:  Patient seen and examined No acute events or new complaints overnight.  Patient reports she is feeling much better Abdominal soreness; minimal No fever, chills, nausea, emesis  Previous leukocytosis is now resolved; WBC 8.1K Hgb to 9.7 sCr - 0.53; UO - 350 ccs + unmeasured  No electrolyte derangements She has passed flatus  NPO this morning pending UGI   Vital signs in last 24 hours: [min-max] current  Temp:  [97.9 F (36.6 C)-98.2 F (36.8 C)] 98.1 F (36.7 C) (12/30 0419) Pulse Rate:  [72-101] 77 (12/30 0419) Resp:  [20] 20 (12/30 0419) BP: (113-150)/(52-71) 113/71 (12/30 0419) SpO2:  [91 %-94 %] 93 % (12/30 0419)     Height: 4\' 7"  (139.7 cm) Weight: 83.9 kg BMI (Calculated): 42.99   Intake/Output last 2 shifts:  12/29 0701 - 12/30 0700 In: 1685.5 [I.V.:1333.9; IV Piggyback:351.5] Out: 350 [Urine:350]   Physical Exam:  Constitutional: alert, cooperative and no distress  HEENT: NGT in place; low output  Respiratory: breathing non-labored at rest  Cardiovascular: regular rate and sinus rhythm  Gastrointestinal: soft, non-tender, and non-distended, no rebound/guarding  Integumentary: Laparoscopic incisions are CDI with dermabond, no erythema or drainage   Labs:     Latest Ref Rng & Units 12/18/2023    4:24 AM 12/16/2023    5:20 AM 12/14/2023    5:44 AM  CBC  WBC 4.0 - 10.5 K/uL 8.1  14.6  9.2   Hemoglobin 12.0 - 15.0 g/dL 9.7  16.1  09.6   Hematocrit 36.0 - 46.0 % 29.2  32.3  34.1   Platelets 150 - 400 K/uL 175  212  225       Latest Ref Rng & Units 12/18/2023    4:24 AM 12/17/2023    4:49 AM 12/16/2023    5:20 AM  CMP  Glucose 70 - 99 mg/dL 045  81  99   BUN 8 - 23 mg/dL 15  28  29    Creatinine 0.44 - 1.00 mg/dL 4.09  8.11  9.14   Sodium 135 - 145 mmol/L 136  141  138   Potassium 3.5 - 5.1 mmol/L 3.6  3.8  4.0   Chloride 98  - 111 mmol/L 102  106  104   CO2 22 - 32 mmol/L 26  25  25    Calcium 8.9 - 10.3 mg/dL 7.9  8.3  8.3      Imaging studies: UGI pending   Assessment/Plan:  82 y.o. female 3 Days Post-Op s/p robotic assisted laparoscopic paraesophageal hernia repair for hiatal hernia and GOO    - She is pending UGI this morning to reassess repair. If this looks good, we can DC NGT and start a diet. I left handout at bedside regarding dietary recommendations after paraesophageal repair.   - Monitor abdominal examination; on-going bowel function - Pain control prn; antiemetics prn   - Mobilize as tolerated - Further management per primary service; we will follow     - Discharge Planning: If UGI is reassuring and she tolerates diet, potentially home tonight vs tomorrow morning   All of the above findings and recommendations were discussed with the patient, patient's family at bedside, and the medical team, and all of patient's and family's questions were answered to their expressed satisfaction.  -- Lynden Oxford, PA-C [facility] 12/18/2023, 6:51 AM M-F: 7am - 4pm

## 2023-12-25 ENCOUNTER — Encounter: Payer: Self-pay | Admitting: Internal Medicine

## 2023-12-25 ENCOUNTER — Ambulatory Visit: Payer: Medicare HMO | Admitting: Internal Medicine

## 2023-12-25 VITALS — BP 124/78 | HR 91 | Ht <= 58 in | Wt 183.0 lb

## 2023-12-25 DIAGNOSIS — Z8719 Personal history of other diseases of the digestive system: Secondary | ICD-10-CM

## 2023-12-25 DIAGNOSIS — Z9889 Other specified postprocedural states: Secondary | ICD-10-CM | POA: Diagnosis not present

## 2023-12-25 DIAGNOSIS — E782 Mixed hyperlipidemia: Secondary | ICD-10-CM | POA: Diagnosis not present

## 2023-12-25 DIAGNOSIS — Z0130 Encounter for examination of blood pressure without abnormal findings: Secondary | ICD-10-CM

## 2023-12-25 DIAGNOSIS — Z23 Encounter for immunization: Secondary | ICD-10-CM

## 2023-12-25 DIAGNOSIS — L719 Rosacea, unspecified: Secondary | ICD-10-CM

## 2023-12-25 NOTE — Progress Notes (Signed)
 New Patient Office Visit  Subjective   Patient ID: Angela Bass, female    DOB: [birth year]  Age: 83 y.o. MRN: [id number]  CC:  Chief Complaint  Patient presents with   Establish Care    NPE    HPI Angela Bass presents to establish care Previous Primary Care provider/office:   she does not have additional concerns to discuss today.   Patient comes in to establish PMD.  She has been in reasonably good health until last year when she started having upper GI symptoms.  She had a history of a large hiatal hernia, but it deteriorated over 1 year so that she developed an acute  gastric volvulus, with partial gastric outlet obstruction, requiring emergency corrective surgery 2 weeks ago.  Patient is now feeling much better, she is recuperating well and healing from her procedure.  She is tolerating her diet and her appetite is improving.    Outpatient Encounter Medications as of 12/25/2023  Medication Sig   doxycycline  (VIBRA -TABS) 100 MG tablet Take 100 mg by mouth daily.   simvastatin  (ZOCOR ) 20 MG tablet Take 1 tablet by mouth daily.   No facility-administered encounter medications on file as of 12/25/2023.    Past Medical History:  Diagnosis Date   DDD (degenerative disc disease), lumbar    walks with cane   Uterine cancer (HCC)    55 years ago    Past Surgical History:  Procedure Laterality Date   ABDOMINAL HYSTERECTOMY     BREAST EXCISIONAL BIOPSY Left 2011   Neg   ESOPHAGOGASTRODUODENOSCOPY N/A 12/14/2023   Procedure: ESOPHAGOGASTRODUODENOSCOPY (EGD);  Surgeon: Jinny Carmine, MD;  Location: [facility] ENDOSCOPY;  Service: Endoscopy;  Laterality: N/A;   INSERTION OF MESH  12/15/2023   Procedure: INSERTION OF MESH;  Surgeon: Jordis Laneta FALCON, MD;  Location: [facility] ORS;  Service: General;;   XI ROBOTIC ASSISTED HIATAL HERNIA REPAIR N/A 12/15/2023   Procedure: XI ROBOTIC ASSISTED PARAESOPHAGEAL;  Surgeon: Jordis Laneta FALCON, MD;  Location: [facility] ORS;  Service: General;  Laterality:  N/A;    Family History  Problem Relation Age of Onset   Breast cancer Mother 23    Social History   Socioeconomic History   Marital status: Widowed    Spouse name: Not on file   Number of children: Not on file   Years of education: Not on file   Highest education level: Not on file  Occupational History   Not on file  Tobacco Use   Smoking status: Never   Smokeless tobacco: Never  Vaping Use   Vaping status: Never Used  Substance and Sexual Activity   Alcohol use: Not Currently   Drug use: Never   Sexual activity: Not on file  Other Topics Concern   Not on file  Social History Narrative   Not on file   Social Drivers of Health   Financial Resource Strain: Not on file  Food Insecurity: No Food Insecurity (12/14/2023)   Hunger Vital Sign    Worried About Running Out of Food in the Last Year: Never true    Ran Out of Food in the Last Year: Never true  Transportation Needs: No Transportation Needs (12/14/2023)   PRAPARE - Administrator, Civil Service (Medical): No    Lack of Transportation (Non-Medical): No  Physical Activity: Not on file  Stress: Not on file  Social Connections: Not on file  Intimate Partner Violence: Not At Risk (12/14/2023)   Humiliation, Afraid, Rape, and Kick  questionnaire    Fear of Current or Ex-Partner: No    Emotionally Abused: No    Physically Abused: No    Sexually Abused: No    Review of Systems  Constitutional: Negative.  Negative for chills, fever and malaise/fatigue.  HENT: Negative.  Negative for congestion and sinus pain.   Eyes: Negative.   Respiratory: Negative.  Negative for cough, shortness of breath and stridor.   Cardiovascular: Negative.  Negative for chest pain, palpitations and leg swelling.  Gastrointestinal: Negative.  Negative for abdominal pain, constipation, diarrhea, heartburn, nausea and vomiting.  Genitourinary: Negative.  Negative for dysuria and flank pain.  Musculoskeletal: Negative.  Negative  for joint pain and myalgias.  Skin: Negative.   Neurological: Negative.  Negative for dizziness, tingling, tremors, sensory change and headaches.  Endo/Heme/Allergies: Negative.   Psychiatric/Behavioral: Negative.  Negative for depression and suicidal ideas. The patient is not nervous/anxious.         Objective   BP 124/78   Pulse 91   Ht 4' 7 (1.397 m)   Wt 183 lb (83 kg)   SpO2 97%   BMI 42.53 kg/m   Physical Exam Vitals and nursing note reviewed.  Constitutional:      Appearance: Normal appearance.  HENT:     Head: Normocephalic and atraumatic.     Nose: Nose normal.     Mouth/Throat:     Mouth: Mucous membranes are moist.     Pharynx: Oropharynx is clear.  Eyes:     Conjunctiva/sclera: Conjunctivae normal.     Pupils: Pupils are equal, round, and reactive to light.  Cardiovascular:     Rate and Rhythm: Normal rate and regular rhythm.     Pulses: Normal pulses.     Heart sounds: Normal heart sounds. No murmur heard. Pulmonary:     Effort: Pulmonary effort is normal.     Breath sounds: Normal breath sounds. No wheezing.  Abdominal:     General: Bowel sounds are normal.     Palpations: Abdomen is soft.     Tenderness: There is no abdominal tenderness. There is no right CVA tenderness or left CVA tenderness.  Musculoskeletal:        General: Normal range of motion.     Cervical back: Normal range of motion.     Right lower leg: No edema.     Left lower leg: No edema.  Skin:    General: Skin is warm and dry.  Neurological:     General: No focal deficit present.     Mental Status: She is alert and oriented to person, place, and time.  Psychiatric:        Mood and Affect: Mood normal.        Behavior: Behavior normal.        Assessment & Plan:  Patient advised to increase her diet as tolerated.  Has an appointment to see her surgeon in a week or so. Will return in 1 month for a follow-up, at that time we will check her labs, and schedule her mammogram and  DEXA scan. Problem List Items Addressed This Visit     Mixed hyperlipidemia - Primary   Rosacea   History of repair of hiatal hernia   Other Visit Diagnoses       Need for immunization against influenza       Relevant Orders   Flu Vaccine Trivalent High Dose (Fluad) (Completed)       Return in about 1 month (around 01/25/2024).  Total time spent: 30 minutes  FERNAND FREDY RAMAN, MD  12/25/2023   This document may have been prepared by [facility] Voice Recognition software and as such may include unintentional dictation errors.

## 2024-01-03 ENCOUNTER — Ambulatory Visit (INDEPENDENT_AMBULATORY_CARE_PROVIDER_SITE_OTHER): Payer: Medicare HMO | Admitting: Surgery

## 2024-01-03 ENCOUNTER — Encounter: Payer: Self-pay | Admitting: Surgery

## 2024-01-03 VITALS — BP 140/81 | HR 101 | Temp 98.2°F | Ht <= 58 in | Wt 173.2 lb

## 2024-01-03 DIAGNOSIS — K440 Diaphragmatic hernia with obstruction, without gangrene: Secondary | ICD-10-CM

## 2024-01-03 DIAGNOSIS — Z09 Encounter for follow-up examination after completed treatment for conditions other than malignant neoplasm: Secondary | ICD-10-CM

## 2024-01-03 NOTE — Progress Notes (Signed)
 Angela Bass is 83 year old female and is 2-1/2 weeks from robotic paraesophageal repair for gastric outlet obstruction.  She is doing well she is tolerating liquid diet.  No fevers no chills positive bowel movement no dysphagia She endorses 0 reflux.  She is very appreciative and happy   PE NAD Abd: Soft, incisions healing well without evidence of infection  A/P Doing ver well We will continue soft fundoplication diet No evidence of complications RTC 2 months

## 2024-01-03 NOTE — Patient Instructions (Signed)

## 2024-01-24 ENCOUNTER — Other Ambulatory Visit: Payer: Medicare HMO

## 2024-01-24 DIAGNOSIS — E782 Mixed hyperlipidemia: Secondary | ICD-10-CM

## 2024-01-24 DIAGNOSIS — Z131 Encounter for screening for diabetes mellitus: Secondary | ICD-10-CM | POA: Diagnosis not present

## 2024-01-24 DIAGNOSIS — I10 Essential (primary) hypertension: Secondary | ICD-10-CM | POA: Diagnosis not present

## 2024-01-24 DIAGNOSIS — R7301 Impaired fasting glucose: Secondary | ICD-10-CM | POA: Diagnosis not present

## 2024-01-25 LAB — CMP14+EGFR
ALT: 13 [IU]/L (ref 0–32)
AST: 25 [IU]/L (ref 0–40)
Albumin: 3.9 g/dL (ref 3.8–4.8)
Alkaline Phosphatase: 96 [IU]/L (ref 44–121)
BUN/Creatinine Ratio: 11 — ABNORMAL LOW (ref 12–28)
BUN: 8 mg/dL (ref 8–27)
Bilirubin Total: 0.5 mg/dL (ref 0.0–1.2)
CO2: 23 mmol/L (ref 20–29)
Calcium: 9.6 mg/dL (ref 8.7–10.3)
Chloride: 102 mmol/L (ref 96–106)
Creatinine, Ser: 0.74 mg/dL (ref 0.57–1.00)
Globulin, Total: 2.5 g/dL (ref 1.5–4.5)
Glucose: 114 mg/dL — ABNORMAL HIGH (ref 70–99)
Potassium: 4.7 mmol/L (ref 3.5–5.2)
Sodium: 142 mmol/L (ref 134–144)
Total Protein: 6.4 g/dL (ref 6.0–8.5)
eGFR: 82 mL/min/{1.73_m2} (ref >59)

## 2024-01-25 LAB — LIPID PANEL
Chol/HDL Ratio: 2.6 {ratio} (ref 0.0–4.4)
Cholesterol, Total: 170 mg/dL (ref 100–199)
HDL: 66 mg/dL (ref >39)
LDL Chol Calc (NIH): 78 mg/dL (ref 0–99)
Triglycerides: 152 mg/dL — ABNORMAL HIGH (ref 0–149)
VLDL Cholesterol Cal: 26 mg/dL (ref 5–40)

## 2024-01-25 LAB — CBC WITH DIFF/PLATELET
Basophils Absolute: 0.1 10*3/uL (ref 0.0–0.2)
Basos: 1 %
EOS (ABSOLUTE): 0.3 10*3/uL (ref 0.0–0.4)
Eos: 4 %
Hematocrit: 41.3 % (ref 34.0–46.6)
Hemoglobin: 13.5 g/dL (ref 11.1–15.9)
Immature Grans (Abs): 0 10*3/uL (ref 0.0–0.1)
Immature Granulocytes: 0 %
Lymphocytes Absolute: 1.3 10*3/uL (ref 0.7–3.1)
Lymphs: 16 %
MCH: 32.8 pg (ref 26.6–33.0)
MCHC: 32.7 g/dL (ref 31.5–35.7)
MCV: 101 fL — ABNORMAL HIGH (ref 79–97)
Monocytes Absolute: 0.6 10*3/uL (ref 0.1–0.9)
Monocytes: 8 %
Neutrophils Absolute: 5.6 10*3/uL (ref 1.4–7.0)
Neutrophils: 71 %
Platelets: 308 10*3/uL (ref 150–450)
RBC: 4.11 x10E6/uL (ref 3.77–5.28)
RDW: 13.9 % (ref 11.7–15.4)
WBC: 7.9 10*3/uL (ref 3.4–10.8)

## 2024-01-25 LAB — HEMOGLOBIN A1C
Est. average glucose Bld gHb Est-mCnc: 111 mg/dL
Hgb A1c MFr Bld: 5.5 % (ref 4.8–5.6)

## 2024-01-26 ENCOUNTER — Ambulatory Visit (INDEPENDENT_AMBULATORY_CARE_PROVIDER_SITE_OTHER): Payer: Medicare HMO | Admitting: Internal Medicine

## 2024-01-26 ENCOUNTER — Encounter: Payer: Self-pay | Admitting: Internal Medicine

## 2024-01-26 VITALS — BP 118/82 | HR 100 | Ht <= 58 in | Wt 171.0 lb

## 2024-01-26 DIAGNOSIS — G8929 Other chronic pain: Secondary | ICD-10-CM | POA: Diagnosis not present

## 2024-01-26 DIAGNOSIS — Z1231 Encounter for screening mammogram for malignant neoplasm of breast: Secondary | ICD-10-CM

## 2024-01-26 DIAGNOSIS — L719 Rosacea, unspecified: Secondary | ICD-10-CM | POA: Diagnosis not present

## 2024-01-26 DIAGNOSIS — M5442 Lumbago with sciatica, left side: Secondary | ICD-10-CM | POA: Diagnosis not present

## 2024-01-26 DIAGNOSIS — Z8719 Personal history of other diseases of the digestive system: Secondary | ICD-10-CM

## 2024-01-26 DIAGNOSIS — E782 Mixed hyperlipidemia: Secondary | ICD-10-CM | POA: Diagnosis not present

## 2024-01-26 DIAGNOSIS — Z0130 Encounter for examination of blood pressure without abnormal findings: Secondary | ICD-10-CM

## 2024-01-26 DIAGNOSIS — Z9189 Other specified personal risk factors, not elsewhere classified: Secondary | ICD-10-CM

## 2024-01-26 DIAGNOSIS — Z9889 Other specified postprocedural states: Secondary | ICD-10-CM | POA: Diagnosis not present

## 2024-01-26 MED ORDER — GABAPENTIN 100 MG PO CAPS: 100.0000 mg | ORAL_CAPSULE | Freq: Two times a day (BID) | ORAL | 3 refills | Status: DC

## 2024-01-26 NOTE — Progress Notes (Signed)
 Established Patient Office Visit  Subjective:  Patient ID: Angela Bass, female    DOB: [birth year]  Age: 83 y.o. MRN: [id number]  Chief Complaint  Patient presents with   Follow-up    1 month follow up    Patient comes in for her 1 month follow-up.  She is feeling stronger now and is able to digest food well although she is still eating a soft diet.  Her bowel movements are also normal.  Her appetite is stable but she is eating small amounts of food.  Lost some weight since her last visit and is happy about it.  Her labs were done recently and the results discussed today. Will now schedule her mammogram and DEXA scan. Patient reports that she has history of lower back pain with sciatica of the left side.  Her pain radiates down her left thigh and leg.  She was previously evaluated and received steroid injections in the back, with not much help.  Agrees to try gabapentin  100 mg at night, and then increase to twice a day.  Also will start stretching exercises at home.  May consider physical therapy.    No other concerns at this time.   Past Medical History:  Diagnosis Date   DDD (degenerative disc disease), lumbar    walks with cane   Uterine cancer (HCC)    55 years ago    Past Surgical History:  Procedure Laterality Date   ABDOMINAL HYSTERECTOMY     BREAST EXCISIONAL BIOPSY Left 2011   Neg   ESOPHAGOGASTRODUODENOSCOPY N/A 12/14/2023   Procedure: ESOPHAGOGASTRODUODENOSCOPY (EGD);  Surgeon: Jinny Carmine, MD;  Location: [facility] ENDOSCOPY;  Service: Endoscopy;  Laterality: N/A;   INSERTION OF MESH  12/15/2023   Procedure: INSERTION OF MESH;  Surgeon: Jordis Laneta FALCON, MD;  Location: [facility] ORS;  Service: General;;   XI ROBOTIC ASSISTED HIATAL HERNIA REPAIR N/A 12/15/2023   Procedure: XI ROBOTIC ASSISTED PARAESOPHAGEAL;  Surgeon: Jordis Laneta FALCON, MD;  Location: [facility] ORS;  Service: General;  Laterality: N/A;    Social History   Socioeconomic History   Marital status: Widowed     Spouse name: Not on file   Number of children: Not on file   Years of education: Not on file   Highest education level: Not on file  Occupational History   Not on file  Tobacco Use   Smoking status: Never   Smokeless tobacco: Never  Vaping Use   Vaping status: Never Used  Substance and Sexual Activity   Alcohol use: Not Currently   Drug use: Never   Sexual activity: Not on file  Other Topics Concern   Not on file  Social History Narrative   Not on file   Social Drivers of Health   Financial Resource Strain: Not on file  Food Insecurity: No Food Insecurity (12/14/2023)   Hunger Vital Sign    Worried About Running Out of Food in the Last Year: Never true    Ran Out of Food in the Last Year: Never true  Transportation Needs: No Transportation Needs (12/14/2023)   PRAPARE - Administrator, Civil Service (Medical): No    Lack of Transportation (Non-Medical): No  Physical Activity: Not on file  Stress: Not on file  Social Connections: Not on file  Intimate Partner Violence: Not At Risk (12/14/2023)   Humiliation, Afraid, Rape, and Kick questionnaire    Fear of Current or Ex-Partner: No    Emotionally Abused: No    Physically  Abused: No    Sexually Abused: No    Family History  Problem Relation Age of Onset   Breast cancer Mother 80    No Known Allergies  Outpatient Medications Prior to Visit  Medication Sig   acetaminophen  (TYLENOL ) 500 MG tablet Take 500 mg by mouth every 6 (six) hours as needed.   doxycycline  (VIBRA -TABS) 100 MG tablet Take 100 mg by mouth daily.   simvastatin  (ZOCOR ) 20 MG tablet Take 1 tablet by mouth daily.   No facility-administered medications prior to visit.    Review of Systems  Constitutional: Negative.  Negative for diaphoresis, fever and malaise/fatigue.  HENT: Negative.    Eyes: Negative.   Respiratory: Negative.  Negative for cough and shortness of breath.   Cardiovascular: Negative.  Negative for chest pain,  palpitations and leg swelling.  Gastrointestinal: Negative.  Negative for abdominal pain, constipation, diarrhea, heartburn, nausea and vomiting.  Genitourinary: Negative.  Negative for dysuria and flank pain.  Musculoskeletal: Negative.  Negative for joint pain and myalgias.  Skin: Negative.   Neurological: Negative.  Negative for dizziness and headaches.  Endo/Heme/Allergies: Negative.   Psychiatric/Behavioral: Negative.  Negative for depression and suicidal ideas. The patient is not nervous/anxious.        Objective:   BP 118/82   Pulse 100   Ht 4' 7 (1.397 m)   Wt 171 lb (77.6 kg)   SpO2 97%   BMI 39.74 kg/m   Vitals:   01/26/24 0959  BP: 118/82  Pulse: 100  Height: 4' 7 (1.397 m)  Weight: 171 lb (77.6 kg)  SpO2: 97%  BMI (Calculated): 39.74    Physical Exam Vitals and nursing note reviewed.  Constitutional:      Appearance: Normal appearance.  HENT:     Head: Normocephalic and atraumatic.     Nose: Nose normal.     Mouth/Throat:     Mouth: Mucous membranes are moist.     Pharynx: Oropharynx is clear.  Eyes:     Conjunctiva/sclera: Conjunctivae normal.     Pupils: Pupils are equal, round, and reactive to light.  Cardiovascular:     Rate and Rhythm: Normal rate and regular rhythm.     Pulses: Normal pulses.     Heart sounds: Normal heart sounds. No murmur heard. Pulmonary:     Effort: Pulmonary effort is normal.     Breath sounds: Normal breath sounds. No wheezing.  Abdominal:     General: Bowel sounds are normal.     Palpations: Abdomen is soft.     Tenderness: There is no abdominal tenderness. There is no right CVA tenderness or left CVA tenderness.  Musculoskeletal:        General: Normal range of motion.     Cervical back: Normal range of motion.     Right lower leg: No edema.     Left lower leg: No edema.  Skin:    General: Skin is warm and dry.  Neurological:     General: No focal deficit present.     Mental Status: She is alert and oriented  to person, place, and time.  Psychiatric:        Mood and Affect: Mood normal.        Behavior: Behavior normal.      No results found for any visits on 01/26/24.  Recent Results (from the past 2160 hours)  Lipase, blood     Status: None   Collection Time: 12/13/23 12:02 PM  Result Value Ref Range  Lipase 26 11 - 51 U/L    Comment: Performed at [facility], [address] [place], [place] [address]  Comprehensive metabolic panel     Status: Abnormal   Collection Time: 12/13/23 12:02 PM  Result Value Ref Range   Sodium 138 135 - 145 mmol/L   Potassium 3.7 3.5 - 5.1 mmol/L    Comment: HEMOLYSIS AT THIS LEVEL MAY AFFECT RESULT   Chloride 96 (L) 98 - 111 mmol/L   CO2 25 22 - 32 mmol/L   Glucose, Bld 142 (H) 70 - 99 mg/dL    Comment: Glucose reference range applies only to samples taken after fasting for at least 8 hours.   BUN 10 8 - 23 mg/dL   Creatinine, Ser 9.25 0.44 - 1.00 mg/dL   Calcium 9.2 8.9 - 89.6 mg/dL   Total Protein 7.3 6.5 - 8.1 g/dL   Albumin  4.0 3.5 - 5.0 g/dL   AST 27 15 - 41 U/L    Comment: HEMOLYSIS AT THIS LEVEL MAY AFFECT RESULT   ALT 14 0 - 44 U/L    Comment: HEMOLYSIS AT THIS LEVEL MAY AFFECT RESULT   Alkaline Phosphatase 65 38 - 126 U/L   Total Bilirubin 1.4 (H) <1.2 mg/dL    Comment: HEMOLYSIS AT THIS LEVEL MAY AFFECT RESULT   GFR, Estimated >60 >60 mL/min    Comment: (NOTE) Calculated using the CKD-EPI Creatinine Equation (2021)    Anion gap 17 (H) 5 - 15    Comment: Performed at [facility], [address] [place], [place] [address]  TSH     Status: None   Collection Time: 12/13/23 12:02 PM  Result Value Ref Range   TSH 3.452 0.350 - 4.500 uIU/mL    Comment: Performed by a 3rd Generation assay with a functional sensitivity of <=0.01 uIU/mL. Performed at [facility], [address] [place], [place] [address]   Urinalysis, Routine w reflex microscopic -Urine, Clean Catch     Status: Abnormal   Collection  Time: 12/13/23 12:36 PM  Result Value Ref Range   Color, Urine YELLOW (A) YELLOW   APPearance HAZY (A) CLEAR   Specific Gravity, Urine 1.024 1.005 - 1.030   pH 5.0 5.0 - 8.0   Glucose, UA NEGATIVE NEGATIVE mg/dL   Hgb urine dipstick NEGATIVE NEGATIVE   Bilirubin Urine NEGATIVE NEGATIVE   Ketones, ur 20 (A) NEGATIVE mg/dL   Protein, ur 899 (A) NEGATIVE mg/dL   Nitrite NEGATIVE NEGATIVE   Leukocytes,Ua NEGATIVE NEGATIVE   RBC / HPF 0-5 0 - 5 RBC/hpf   WBC, UA 21-50 0 - 5 WBC/hpf   Bacteria, UA RARE (A) NONE SEEN   Squamous Epithelial / HPF 21-50 0 - 5 /HPF   Mucus PRESENT     Comment: Performed at [facility], [address] [place], [place] [address]  CBC     Status: Abnormal   Collection Time: 12/13/23 12:36 PM  Result Value Ref Range   WBC 11.6 (H) 4.0 - 10.5 K/uL   RBC 3.97 3.87 - 5.11 MIL/uL   Hemoglobin 13.0 12.0 - 15.0 g/dL   HCT 62.3 63.9 - 53.9 %   MCV 94.7 80.0 - 100.0 fL   MCH 32.7 26.0 - 34.0 pg   MCHC 34.6 30.0 - 36.0 g/dL   RDW 86.6 88.4 - 84.4 %   Platelets 264 150 - 400 K/uL   nRBC 0.0 0.0 - 0.2 %    Comment: Performed at [facility],  [address], [place], [place] [address]  Urine Culture     Status: Abnormal   Collection Time: 12/13/23 12:36 PM   Specimen: Urine, Clean Catch  Result Value Ref Range   Specimen Description      URINE, CLEAN CATCH Performed at [facility], [address] [place], [place] [address]    Special Requests      NONE Performed at [facility], [address] [place], [place] [address]    Culture 70,000 COLONIES/mL ESCHERICHIA COLI (A)    Report Status 12/15/2023 FINAL    Organism ID, Bacteria ESCHERICHIA COLI (A)       Susceptibility   Escherichia coli - MIC*    AMPICILLIN >=32 RESISTANT Resistant     CEFAZOLIN  <=4 SENSITIVE Sensitive     CEFEPIME <=0.12 SENSITIVE Sensitive     CEFTRIAXONE <=0.25 SENSITIVE Sensitive     CIPROFLOXACIN 2 RESISTANT Resistant     GENTAMICIN <=1  SENSITIVE Sensitive     IMIPENEM <=0.25 SENSITIVE Sensitive     NITROFURANTOIN 64 INTERMEDIATE Intermediate     TRIMETH/SULFA <=20 SENSITIVE Sensitive     AMPICILLIN/SULBACTAM >=32 RESISTANT Resistant     PIP/TAZO 8 SENSITIVE Sensitive ug/mL    * 70,000 COLONIES/mL ESCHERICHIA COLI  Lactic acid, plasma     Status: None   Collection Time: 12/13/23  2:18 PM  Result Value Ref Range   Lactic Acid, Venous 0.6 0.5 - 1.9 mmol/L    Comment: Performed at [facility], [address] [place], [place] [address]  Lactic acid, plasma     Status: None   Collection Time: 12/14/23  5:44 AM  Result Value Ref Range   Lactic Acid, Venous 1.0 0.5 - 1.9 mmol/L    Comment: Performed at [facility], [address] [place], [place] [address]  CBC     Status: Abnormal   Collection Time: 12/14/23  5:44 AM  Result Value Ref Range   WBC 9.2 4.0 - 10.5 K/uL   RBC 3.51 (L) 3.87 - 5.11 MIL/uL   Hemoglobin 11.4 (L) 12.0 - 15.0 g/dL   HCT 65.8 (L) 63.9 - 53.9 %   MCV 97.2 80.0 - 100.0 fL   MCH 32.5 26.0 - 34.0 pg   MCHC 33.4 30.0 - 36.0 g/dL   RDW 86.1 88.4 - 84.4 %   Platelets 225 150 - 400 K/uL   nRBC 0.0 0.0 - 0.2 %    Comment: Performed at [facility], [address] [place], [place] [address]  Basic metabolic panel     Status: Abnormal   Collection Time: 12/14/23  5:44 AM  Result Value Ref Range   Sodium 138 135 - 145 mmol/L   Potassium 3.2 (L) 3.5 - 5.1 mmol/L   Chloride 100 98 - 111 mmol/L   CO2 28 22 - 32 mmol/L   Glucose, Bld 106 (H) 70 - 99 mg/dL    Comment: Glucose reference range applies only to samples taken after fasting for at least 8 hours.   BUN 11 8 - 23 mg/dL   Creatinine, Ser 9.17 0.44 - 1.00 mg/dL   Calcium 8.3 (L) 8.9 - 10.3 mg/dL   GFR, Estimated >39 >39 mL/min    Comment: (NOTE) Calculated using the CKD-EPI Creatinine Equation (2021)    Anion gap 10 5 - 15    Comment: Performed at [facility], [address] [place], [place]  [address]  ABO/Rh     Status: None  Collection Time: 12/14/23  5:44 AM  Result Value Ref Range   ABO/RH(D)      O POS Performed at [facility], [address] [place], [place] [address]   Type and screen [facility]     Status: None   Collection Time: 12/14/23  2:18 PM  Result Value Ref Range   ABO/RH(D) O POS    Antibody Screen NEG    Sample Expiration      12/17/2023,2359 Performed at [facility], [address] [place], [place] [address]   Basic metabolic panel     Status: None   Collection Time: 12/15/23  5:24 AM  Result Value Ref Range   Sodium 140 135 - 145 mmol/L   Potassium 4.7 3.5 - 5.1 mmol/L   Chloride 102 98 - 111 mmol/L   CO2 27 22 - 32 mmol/L   Glucose, Bld 95 70 - 99 mg/dL    Comment: Glucose reference range applies only to samples taken after fasting for at least 8 hours.   BUN 19 8 - 23 mg/dL   Creatinine, Ser 9.27 0.44 - 1.00 mg/dL   Calcium 8.9 8.9 - 89.6 mg/dL   GFR, Estimated >39 >39 mL/min    Comment: (NOTE) Calculated using the CKD-EPI Creatinine Equation (2021)    Anion gap 11 5 - 15    Comment: Performed at [facility], [address] [place], [place] [address]  Magnesium     Status: None   Collection Time: 12/15/23  5:24 AM  Result Value Ref Range   Magnesium 2.1 1.7 - 2.4 mg/dL    Comment: Performed at [facility], [address] [place], [place] [address]  Glucose, capillary     Status: Abnormal   Collection Time: 12/15/23 12:21 PM  Result Value Ref Range   Glucose-Capillary 129 (H) 70 - 99 mg/dL    Comment: Glucose reference range applies only to samples taken after fasting for at least 8 hours.  CBC     Status: Abnormal   Collection Time: 12/16/23  5:20 AM  Result Value Ref Range   WBC 14.6 (H) 4.0 - 10.5 K/uL   RBC 3.25 (L) 3.87 - 5.11 MIL/uL   Hemoglobin 10.7 (L) 12.0 - 15.0 g/dL   HCT 67.6 (L) 63.9 - 53.9 %   MCV 99.4 80.0 - 100.0 fL   MCH 32.9 26.0 - 34.0 pg   MCHC  33.1 30.0 - 36.0 g/dL   RDW 86.0 88.4 - 84.4 %   Platelets 212 150 - 400 K/uL   nRBC 0.0 0.0 - 0.2 %    Comment: Performed at [facility], [address], [place], [place] [address]  Basic metabolic panel     Status: Abnormal   Collection Time: 12/16/23  5:20 AM  Result Value Ref Range   Sodium 138 135 - 145 mmol/L   Potassium 4.0 3.5 - 5.1 mmol/L   Chloride 104 98 - 111 mmol/L   CO2 25 22 - 32 mmol/L   Glucose, Bld 99 70 - 99 mg/dL    Comment: Glucose reference range applies only to samples taken after fasting for at least 8 hours.   BUN 29 (H) 8 - 23 mg/dL   Creatinine, Ser 9.19 0.44 - 1.00 mg/dL   Calcium 8.3 (L) 8.9 - 10.3 mg/dL   GFR, Estimated >39 >39 mL/min    Comment: (NOTE) Calculated using the CKD-EPI Creatinine Equation (2021)    Anion gap 9 5 -  15    Comment: Performed at [facility], [address] [place], [place] [address]  Magnesium     Status: None   Collection Time: 12/16/23  5:20 AM  Result Value Ref Range   Magnesium 2.1 1.7 - 2.4 mg/dL    Comment: Performed at [facility], [address] [place], [place] [address]  Basic metabolic panel     Status: Abnormal   Collection Time: 12/17/23  4:49 AM  Result Value Ref Range   Sodium 141 135 - 145 mmol/L   Potassium 3.8 3.5 - 5.1 mmol/L   Chloride 106 98 - 111 mmol/L   CO2 25 22 - 32 mmol/L   Glucose, Bld 81 70 - 99 mg/dL    Comment: Glucose reference range applies only to samples taken after fasting for at least 8 hours.   BUN 28 (H) 8 - 23 mg/dL   Creatinine, Ser 9.24 0.44 - 1.00 mg/dL   Calcium 8.3 (L) 8.9 - 10.3 mg/dL   GFR, Estimated >39 >39 mL/min    Comment: (NOTE) Calculated using the CKD-EPI Creatinine Equation (2021)    Anion gap 10 5 - 15    Comment: Performed at [facility], [address], [place], [place] [address]  Magnesium     Status: None   Collection Time: 12/17/23  4:49 AM  Result Value Ref Range   Magnesium 2.2 1.7 - 2.4 mg/dL    Comment:  Performed at [facility], [address] [place], [place] [address]  Procalcitonin     Status: None   Collection Time: 12/17/23  4:49 AM  Result Value Ref Range   Procalcitonin 0.17 ng/mL    Comment:        Interpretation: PCT (Procalcitonin) <= 0.5 ng/mL: Systemic infection (sepsis) is not likely. Local bacterial infection is possible. (NOTE)       Sepsis PCT Algorithm           Lower Respiratory Tract                                      Infection PCT Algorithm    ----------------------------     ----------------------------         PCT < 0.25 ng/mL                PCT < 0.10 ng/mL          Strongly encourage             Strongly discourage   discontinuation of antibiotics    initiation of antibiotics    ----------------------------     -----------------------------       PCT 0.25 - 0.50 ng/mL            PCT 0.10 - 0.25 ng/mL               OR       >80% decrease in PCT            Discourage initiation of                                            antibiotics      Encourage discontinuation           of antibiotics    ----------------------------     -----------------------------  PCT >= 0.50 ng/mL              PCT 0.26 - 0.50 ng/mL               AND        <80% decrease in PCT             Encourage initiation of                                             antibiotics       Encourage continuation           of antibiotics    ----------------------------     -----------------------------        PCT >= 0.50 ng/mL                  PCT > 0.50 ng/mL               AND         increase in PCT                  Strongly encourage                                      initiation of antibiotics    Strongly encourage escalation           of antibiotics                                     -----------------------------                                           PCT <= 0.25 ng/mL                                                 OR                                        > 80%  decrease in PCT                                      Discontinue / Do not initiate                                             antibiotics  Performed at [facility], [address] [place], [place] [address]   CBC     Status: Abnormal   Collection Time: 12/18/23  4:24 AM  Result Value Ref Range   WBC 8.1 4.0 - 10.5 K/uL   RBC 2.97 (L) 3.87 - 5.11 MIL/uL   Hemoglobin 9.7 (L) 12.0 - 15.0 g/dL   HCT  29.2 (L) 36.0 - 46.0 %   MCV 98.3 80.0 - 100.0 fL   MCH 32.7 26.0 - 34.0 pg   MCHC 33.2 30.0 - 36.0 g/dL   RDW 85.9 88.4 - 84.4 %   Platelets 175 150 - 400 K/uL   nRBC 0.0 0.0 - 0.2 %    Comment: Performed at [facility], [address], [place], [place] [address]  Basic metabolic panel     Status: Abnormal   Collection Time: 12/18/23  4:24 AM  Result Value Ref Range   Sodium 136 135 - 145 mmol/L   Potassium 3.6 3.5 - 5.1 mmol/L   Chloride 102 98 - 111 mmol/L   CO2 26 22 - 32 mmol/L   Glucose, Bld 119 (H) 70 - 99 mg/dL    Comment: Glucose reference range applies only to samples taken after fasting for at least 8 hours.   BUN 15 8 - 23 mg/dL   Creatinine, Ser 9.46 0.44 - 1.00 mg/dL   Calcium 7.9 (L) 8.9 - 10.3 mg/dL   GFR, Estimated >39 >39 mL/min    Comment: (NOTE) Calculated using the CKD-EPI Creatinine Equation (2021)    Anion gap 8 5 - 15    Comment: Performed at [facility], [address] [place], [place] [address]  Magnesium     Status: None   Collection Time: 12/18/23  4:24 AM  Result Value Ref Range   Magnesium 2.1 1.7 - 2.4 mg/dL    Comment: Performed at [facility], [address] [place], [place] [address]  Hemoglobin A1c     Status: None   Collection Time: 01/24/24  8:57 AM  Result Value Ref Range   Hgb A1c MFr Bld 5.5 4.8 - 5.6 %    Comment:          Prediabetes: 5.7 - 6.4          Diabetes: >6.4          Glycemic control for adults with diabetes: <7.0    Est. average glucose Bld gHb Est-mCnc 111 mg/dL  Lipid  panel     Status: Abnormal   Collection Time: 01/24/24  8:57 AM  Result Value Ref Range   Cholesterol, Total 170 100 - 199 mg/dL   Triglycerides 847 (H) 0 - 149 mg/dL   HDL 66 >60 mg/dL   VLDL Cholesterol Cal 26 5 - 40 mg/dL   LDL Chol Calc (NIH) 78 0 - 99 mg/dL   Chol/HDL Ratio 2.6 0.0 - 4.4 ratio    Comment:                                   T. Chol/HDL Ratio                                             Men  Women                               1/2 Avg.Risk  3.4    3.3                                   Avg.Risk  5.0  4.4                                2X Avg.Risk  9.6    7.1                                3X Avg.Risk 23.4   11.0   CBC With Diff/Platelet     Status: Abnormal   Collection Time: 01/24/24  8:57 AM  Result Value Ref Range   WBC 7.9 3.4 - 10.8 x10E3/uL   RBC 4.11 3.77 - 5.28 x10E6/uL   Hemoglobin 13.5 11.1 - 15.9 g/dL   Hematocrit 58.6 65.9 - 46.6 %   MCV 101 (H) 79 - 97 fL   MCH 32.8 26.6 - 33.0 pg   MCHC 32.7 31.5 - 35.7 g/dL   RDW 86.0 88.2 - 84.5 %   Platelets 308 150 - 450 x10E3/uL   Neutrophils 71 Not Estab. %   Lymphs 16 Not Estab. %   Monocytes 8 Not Estab. %   Eos 4 Not Estab. %   Basos 1 Not Estab. %   Neutrophils Absolute 5.6 1.4 - 7.0 x10E3/uL   Lymphocytes Absolute 1.3 0.7 - 3.1 x10E3/uL   Monocytes Absolute 0.6 0.1 - 0.9 x10E3/uL   EOS (ABSOLUTE) 0.3 0.0 - 0.4 x10E3/uL   Basophils Absolute 0.1 0.0 - 0.2 x10E3/uL   Immature Granulocytes 0 Not Estab. %   Immature Grans (Abs) 0.0 0.0 - 0.1 x10E3/uL  CMP14+EGFR     Status: Abnormal   Collection Time: 01/24/24  8:57 AM  Result Value Ref Range   Glucose 114 (H) 70 - 99 mg/dL   BUN 8 8 - 27 mg/dL   Creatinine, Ser 9.25 0.57 - 1.00 mg/dL   eGFR 82 >40 fO/fpw/8.26   BUN/Creatinine Ratio 11 (L) 12 - 28   Sodium 142 134 - 144 mmol/L   Potassium 4.7 3.5 - 5.2 mmol/L   Chloride 102 96 - 106 mmol/L   CO2 23 20 - 29 mmol/L   Calcium 9.6 8.7 - 10.3 mg/dL   Total Protein 6.4 6.0 - 8.5 g/dL   Albumin  3.9  3.8 - 4.8 g/dL   Globulin, Total 2.5 1.5 - 4.5 g/dL   Bilirubin Total 0.5 0.0 - 1.2 mg/dL   Alkaline Phosphatase 96 44 - 121 IU/L   AST 25 0 - 40 IU/L   ALT 13 0 - 32 IU/L      Assessment & Plan:  Continue medications.  At gabapentin .  Schedule mammogram and DEXA scan. Problem List Items Addressed This Visit     Mixed hyperlipidemia - Primary   Rosacea   History of repair of hiatal hernia   Chronic bilateral low back pain with left-sided sciatica   Relevant Medications   acetaminophen  (TYLENOL ) 500 MG tablet   gabapentin  (NEURONTIN ) 100 MG capsule   Other Visit Diagnoses       Breast cancer screening by mammogram       Relevant Orders   MM 3D SCREENING MAMMOGRAM BILATERAL BREAST     At high risk for osteoporosis       Relevant Orders   DG Bone Density       Return in about 3 months (around 04/24/2024).   Total time spent: 30 minutes  FERNAND FREDY RAMAN, MD  01/26/2024   This document may have been prepared by [employer] Voice Recognition  software and as such may include unintentional dictation errors.

## 2024-02-07 ENCOUNTER — Other Ambulatory Visit: Payer: Medicare HMO

## 2024-02-14 ENCOUNTER — Ambulatory Visit: Payer: Medicare HMO

## 2024-02-14 DIAGNOSIS — M8589 Other specified disorders of bone density and structure, multiple sites: Secondary | ICD-10-CM

## 2024-02-14 DIAGNOSIS — Z9189 Other specified personal risk factors, not elsewhere classified: Secondary | ICD-10-CM | POA: Diagnosis not present

## 2024-02-15 NOTE — Progress Notes (Signed)
 Informed via Mychart message

## 2024-02-26 ENCOUNTER — Other Ambulatory Visit: Payer: Self-pay

## 2024-02-26 MED ORDER — SIMVASTATIN 20 MG PO TABS: 20.0000 mg | ORAL_TABLET | Freq: Every day | ORAL | 3 refills | Status: AC

## 2024-03-12 ENCOUNTER — Other Ambulatory Visit: Payer: Self-pay

## 2024-03-12 MED ORDER — FUROSEMIDE 20 MG PO TABS: 20.0000 mg | ORAL_TABLET | Freq: Every day | ORAL | 1 refills | Status: DC

## 2024-03-13 ENCOUNTER — Other Ambulatory Visit: Payer: Self-pay

## 2024-03-14 MED ORDER — DOXYCYCLINE HYCLATE 100 MG PO TABS: 100.0000 mg | ORAL_TABLET | Freq: Every day | ORAL | 2 refills | Status: DC

## 2024-04-08 ENCOUNTER — Ambulatory Visit: Admitting: Surgery

## 2024-04-08 ENCOUNTER — Encounter: Payer: Self-pay | Admitting: Surgery

## 2024-04-08 VITALS — BP 123/59 | HR 81 | Temp 98.3°F | Ht <= 58 in | Wt 168.0 lb

## 2024-04-08 DIAGNOSIS — K440 Diaphragmatic hernia with obstruction, without gangrene: Secondary | ICD-10-CM

## 2024-04-08 DIAGNOSIS — Z09 Encounter for follow-up examination after completed treatment for conditions other than malignant neoplasm: Secondary | ICD-10-CM | POA: Diagnosis not present

## 2024-04-08 NOTE — Patient Instructions (Signed)
Please call the office if you have any questions or concerns. 

## 2024-04-10 NOTE — Progress Notes (Signed)
 Outpatient Surgical Follow Up    Angela Bass is an 83 y.o. female.   Chief Complaint  Patient presents with   Follow-up    Paraesophageal hernia repair 12/15/23    HPI: s/p paraesophageal 4 months ago, doing great considering, taking Po, no fevers  Past Medical History:  Diagnosis Date   DDD (degenerative disc disease), lumbar    walks with cane   Uterine cancer (HCC)    55 years ago    Past Surgical History:  Procedure Laterality Date   ABDOMINAL HYSTERECTOMY     BREAST EXCISIONAL BIOPSY Left 2011   Neg   ESOPHAGOGASTRODUODENOSCOPY N/A 12/14/2023   Procedure: ESOPHAGOGASTRODUODENOSCOPY (EGD);  Surgeon: Marnee Sink, MD;  Location: [facility] ENDOSCOPY;  Service: Endoscopy;  Laterality: N/A;   INSERTION OF MESH  12/15/2023   Procedure: INSERTION OF MESH;  Surgeon: Alben Alma, MD;  Location: [facility] ORS;  Service: General;;   XI ROBOTIC ASSISTED HIATAL HERNIA REPAIR N/A 12/15/2023   Procedure: XI ROBOTIC ASSISTED PARAESOPHAGEAL;  Surgeon: Alben Alma, MD;  Location: [facility] ORS;  Service: General;  Laterality: N/A;    Family History  Problem Relation Age of Onset   Breast cancer Mother 6    Social History:  reports that she has never smoked. She has never used smokeless tobacco. She reports that she does not currently use alcohol. She reports that she does not use drugs.  Allergies: No Known Allergies  Medications reviewed.    ROS Full ROS performed and is otherwise negative other than what is stated in HPI   BP (!) 123/59   Pulse 81   Temp 98.3 F (36.8 C) (Oral)   Ht 4' 7.5" (1.41 m)   Wt 168 lb (76.2 kg)   SpO2 93%   BMI 38.35 kg/m   Physical Exam Constitutional: alert, cooperative and no distress  HEENT: NGT in place; low output  Respiratory: breathing non-labored at rest  Cardiovascular: regular rate and sinus rhythm  Gastrointestinal: soft, non-tender, and non-distended, no rebound/guarding  Integumentary: Laparoscopic incisions arehealed, no  infection or hernias    Assessment/Plan: Doing very well w/o complications, she is very happy. RTC prn I spent 20 min in this encounter including coordinating her care, placing order and performing documentation   Evelia Hipp, MD [facility] General Surgeon

## 2024-05-03 ENCOUNTER — Encounter: Payer: Self-pay | Admitting: Internal Medicine

## 2024-05-03 ENCOUNTER — Ambulatory Visit (INDEPENDENT_AMBULATORY_CARE_PROVIDER_SITE_OTHER): Payer: Medicare HMO | Admitting: Internal Medicine

## 2024-05-03 VITALS — BP 122/80 | HR 86 | Ht <= 58 in | Wt 166.2 lb

## 2024-05-03 DIAGNOSIS — I10 Essential (primary) hypertension: Secondary | ICD-10-CM | POA: Diagnosis not present

## 2024-05-03 DIAGNOSIS — M5442 Lumbago with sciatica, left side: Secondary | ICD-10-CM

## 2024-05-03 DIAGNOSIS — E782 Mixed hyperlipidemia: Secondary | ICD-10-CM

## 2024-05-03 DIAGNOSIS — G8929 Other chronic pain: Secondary | ICD-10-CM

## 2024-05-03 DIAGNOSIS — Z1231 Encounter for screening mammogram for malignant neoplasm of breast: Secondary | ICD-10-CM | POA: Diagnosis not present

## 2024-05-03 DIAGNOSIS — Z131 Encounter for screening for diabetes mellitus: Secondary | ICD-10-CM

## 2024-05-03 MED ORDER — PREDNISONE 20 MG PO TABS: 40.0000 mg | ORAL_TABLET | Freq: Every day | ORAL | 0 refills | Status: DC

## 2024-05-03 MED ORDER — GABAPENTIN 300 MG PO CAPS: 300.0000 mg | ORAL_CAPSULE | Freq: Two times a day (BID) | ORAL | 4 refills | Status: DC

## 2024-05-03 NOTE — Progress Notes (Signed)
 Established Patient Office Visit  Subjective:  Patient ID: Angela Bass, female    DOB: 02-Feb-1941  Age: 83 y.o. MRN: [id number]  Chief Complaint  Patient presents with   Follow-up    3 month follow up    Patient comes in for follow-up accompanied by her daughter.  She is generally feeling well and her appetite and energy levels have returned.  However she continues to have lower back pain along with radiation down her left leg.  Currently she is on gabapentin  but it is not helping her much.  Patient agrees to try a prednisone burst regimen followed by increased dose of gabapentin .  Also needs to schedule a mammogram. Patient is due for blood work.    No other concerns at this time.   Past Medical History:  Diagnosis Date   DDD (degenerative disc disease), lumbar    walks with cane   Uterine cancer (HCC)    55 years ago    Past Surgical History:  Procedure Laterality Date   ABDOMINAL HYSTERECTOMY     BREAST EXCISIONAL BIOPSY Left 2011   Neg   ESOPHAGOGASTRODUODENOSCOPY N/A 12/14/2023   Procedure: ESOPHAGOGASTRODUODENOSCOPY (EGD);  Surgeon: Marnee Sink, MD;  Location: [facility] ENDOSCOPY;  Service: Endoscopy;  Laterality: N/A;   INSERTION OF MESH  12/15/2023   Procedure: INSERTION OF MESH;  Surgeon: Alben Alma, MD;  Location: [facility] ORS;  Service: General;;   XI ROBOTIC ASSISTED HIATAL HERNIA REPAIR N/A 12/15/2023   Procedure: XI ROBOTIC ASSISTED PARAESOPHAGEAL;  Surgeon: Alben Alma, MD;  Location: [facility] ORS;  Service: General;  Laterality: N/A;    Social History   Socioeconomic History   Marital status: Widowed    Spouse name: Not on file   Number of children: Not on file   Years of education: Not on file   Highest education level: Not on file  Occupational History   Not on file  Tobacco Use   Smoking status: Never   Smokeless tobacco: Never  Vaping Use   Vaping status: Never Used  Substance and Sexual Activity   Alcohol use: Not Currently   Drug use:  Never   Sexual activity: Not on file  Other Topics Concern   Not on file  Social History Narrative   Not on file   Social Drivers of Health   Financial Resource Strain: Not on file  Food Insecurity: No Food Insecurity (12/14/2023)   Hunger Vital Sign    Worried About Running Out of Food in the Last Year: Never true    Ran Out of Food in the Last Year: Never true  Transportation Needs: No Transportation Needs (12/14/2023)   PRAPARE - Administrator, Civil Service (Medical): No    Lack of Transportation (Non-Medical): No  Physical Activity: Not on file  Stress: Not on file  Social Connections: Not on file  Intimate Partner Violence: Not At Risk (12/14/2023)   Humiliation, Afraid, Rape, and Kick questionnaire    Fear of Current or Ex-Partner: No    Emotionally Abused: No    Physically Abused: No    Sexually Abused: No    Family History  Problem Relation Age of Onset   Breast cancer Mother 37    No Known Allergies  Outpatient Medications Prior to Visit  Medication Sig   acetaminophen  (TYLENOL ) 500 MG tablet Take 500 mg by mouth every 6 (six) hours as needed.   simvastatin  (ZOCOR ) 20 MG tablet Take 1 tablet (20 mg total) by  mouth daily.   [DISCONTINUED] doxycycline  (VIBRA -TABS) 100 MG tablet Take 1 tablet (100 mg total) by mouth daily.   [DISCONTINUED] gabapentin  (NEURONTIN ) 100 MG capsule Take 1 capsule (100 mg total) by mouth 2 (two) times daily.   No facility-administered medications prior to visit.    Review of Systems  Constitutional: Negative.  Negative for chills, fever, malaise/fatigue and weight loss.  HENT: Negative.  Negative for sore throat.   Eyes: Negative.   Respiratory: Negative.  Negative for cough and shortness of breath.   Cardiovascular: Negative.  Negative for chest pain, palpitations and leg swelling.  Gastrointestinal: Negative.  Negative for abdominal pain, constipation, diarrhea, heartburn, nausea and vomiting.  Genitourinary:  Negative.  Negative for dysuria and flank pain.  Musculoskeletal:  Positive for back pain. Negative for joint pain and myalgias.  Skin: Negative.   Neurological: Negative.  Negative for dizziness and headaches.  Endo/Heme/Allergies: Negative.   Psychiatric/Behavioral: Negative.  Negative for depression and suicidal ideas. The patient is not nervous/anxious.        Objective:   BP 122/80   Pulse 86   Ht 4\' 7"  (1.397 m)   Wt 166 lb 3.2 oz (75.4 kg)   SpO2 95%   BMI 38.63 kg/m   Vitals:   05/03/24 0939  BP: 122/80  Pulse: 86  Height: 4\' 7"  (1.397 m)  Weight: 166 lb 3.2 oz (75.4 kg)  SpO2: 95%  BMI (Calculated): 38.63    Physical Exam Vitals and nursing note reviewed.  Constitutional:      Appearance: Normal appearance.  HENT:     Head: Normocephalic and atraumatic.     Nose: Nose normal.     Mouth/Throat:     Mouth: Mucous membranes are moist.     Pharynx: Oropharynx is clear.  Eyes:     Conjunctiva/sclera: Conjunctivae normal.     Pupils: Pupils are equal, round, and reactive to light.  Cardiovascular:     Rate and Rhythm: Normal rate and regular rhythm.     Pulses: Normal pulses.     Heart sounds: Normal heart sounds. No murmur heard. Pulmonary:     Effort: Pulmonary effort is normal.     Breath sounds: Normal breath sounds. No wheezing.  Abdominal:     General: Bowel sounds are normal.     Palpations: Abdomen is soft.     Tenderness: There is no abdominal tenderness. There is no right CVA tenderness or left CVA tenderness.  Musculoskeletal:        General: Normal range of motion.     Cervical back: Normal range of motion.     Right lower leg: No edema.     Left lower leg: No edema.  Skin:    General: Skin is warm and dry.  Neurological:     General: No focal deficit present.     Mental Status: She is alert and oriented to person, place, and time.  Psychiatric:        Mood and Affect: Mood normal.        Behavior: Behavior normal.      No results  found for any visits on 05/03/24.  No results found for this or any previous visit (from the past 2160 hours).    Assessment & Plan:  Check labs today.  Schedule mammogram.  Trial of prednisone burst.  Increase gabapentin  to 300 mg twice a day. Problem List Items Addressed This Visit     Mixed hyperlipidemia   Relevant Orders   Lipid  panel   Chronic bilateral low back pain with left-sided sciatica   Relevant Medications   predniSONE (DELTASONE) 20 MG tablet   gabapentin  (NEURONTIN ) 300 MG capsule   Essential hypertension - Primary   Relevant Orders   CMP14+EGFR   Lipid panel   CBC With Diff/Platelet   Hemoglobin A1c   Other Visit Diagnoses       Breast cancer screening by mammogram       Relevant Orders   MM 3D SCREENING MAMMOGRAM BILATERAL BREAST     Screening for diabetes mellitus       Relevant Orders   Hemoglobin A1c       Return in about 10 days (around 05/13/2024).   Total time spent: 30 minutes  Aisha Hove, MD  05/03/2024   This document may have been prepared by [facility] Voice Recognition software and as such may include unintentional dictation errors.

## 2024-05-04 LAB — CMP14+EGFR
ALT: 13 IU/L (ref 0–32)
AST: 23 IU/L (ref 0–40)
Albumin: 4.4 g/dL (ref 3.8–4.8)
Alkaline Phosphatase: 86 IU/L (ref 44–121)
BUN/Creatinine Ratio: 17 (ref 12–28)
BUN: 15 mg/dL (ref 8–27)
Bilirubin Total: 0.4 mg/dL (ref 0.0–1.2)
CO2: 22 mmol/L (ref 20–29)
Calcium: 9.9 mg/dL (ref 8.7–10.3)
Chloride: 102 mmol/L (ref 96–106)
Creatinine, Ser: 0.88 mg/dL (ref 0.57–1.00)
Globulin, Total: 2.3 g/dL (ref 1.5–4.5)
Glucose: 117 mg/dL — ABNORMAL HIGH (ref 70–99)
Potassium: 4.8 mmol/L (ref 3.5–5.2)
Sodium: 141 mmol/L (ref 134–144)
Total Protein: 6.7 g/dL (ref 6.0–8.5)
eGFR: 66 mL/min/{1.73_m2} (ref >59)

## 2024-05-15 ENCOUNTER — Other Ambulatory Visit

## 2024-05-15 DIAGNOSIS — I10 Essential (primary) hypertension: Secondary | ICD-10-CM | POA: Diagnosis not present

## 2024-05-15 DIAGNOSIS — Z131 Encounter for screening for diabetes mellitus: Secondary | ICD-10-CM | POA: Diagnosis not present

## 2024-05-15 DIAGNOSIS — E782 Mixed hyperlipidemia: Secondary | ICD-10-CM

## 2024-05-16 ENCOUNTER — Ambulatory Visit: Payer: Self-pay | Admitting: Internal Medicine

## 2024-05-16 LAB — HEMOGLOBIN A1C
Est. average glucose Bld gHb Est-mCnc: 120 mg/dL
Hgb A1c MFr Bld: 5.8 % — ABNORMAL HIGH (ref 4.8–5.6)

## 2024-05-16 LAB — CBC WITH DIFF/PLATELET
Basophils Absolute: 0.1 10*3/uL (ref 0.0–0.2)
Basos: 1 %
EOS (ABSOLUTE): 0.4 10*3/uL (ref 0.0–0.4)
Eos: 5 %
Hematocrit: 42.7 % (ref 34.0–46.6)
Hemoglobin: 14 g/dL (ref 11.1–15.9)
Immature Grans (Abs): 0 10*3/uL (ref 0.0–0.1)
Immature Granulocytes: 0 %
Lymphocytes Absolute: 1.4 10*3/uL (ref 0.7–3.1)
Lymphs: 18 %
MCH: 33.7 pg — ABNORMAL HIGH (ref 26.6–33.0)
MCHC: 32.8 g/dL (ref 31.5–35.7)
MCV: 103 fL — ABNORMAL HIGH (ref 79–97)
Monocytes Absolute: 0.9 10*3/uL (ref 0.1–0.9)
Monocytes: 11 %
Neutrophils Absolute: 5.1 10*3/uL (ref 1.4–7.0)
Neutrophils: 65 %
Platelets: 218 10*3/uL (ref 150–450)
RBC: 4.15 x10E6/uL (ref 3.77–5.28)
RDW: 13 % (ref 11.7–15.4)
WBC: 7.9 10*3/uL (ref 3.4–10.8)

## 2024-05-16 LAB — LIPID PANEL
Chol/HDL Ratio: 2.5 ratio (ref 0.0–4.4)
Cholesterol, Total: 191 mg/dL (ref 100–199)
HDL: 75 mg/dL (ref >39)
LDL Chol Calc (NIH): 91 mg/dL (ref 0–99)
Triglycerides: 144 mg/dL (ref 0–149)
VLDL Cholesterol Cal: 25 mg/dL (ref 5–40)

## 2024-05-17 ENCOUNTER — Ambulatory Visit
Admission: RE | Admit: 2024-05-17 | Discharge: 2024-05-17 | Disposition: A | Discharge status: Home / Self Care | Source: Ambulatory Visit | Attending: Internal Medicine | Admitting: Internal Medicine

## 2024-05-17 DIAGNOSIS — Z1231 Encounter for screening mammogram for malignant neoplasm of breast: Secondary | ICD-10-CM | POA: Insufficient documentation

## 2024-05-23 ENCOUNTER — Encounter: Payer: Self-pay | Admitting: Internal Medicine

## 2024-05-23 ENCOUNTER — Ambulatory Visit: Admitting: Internal Medicine

## 2024-05-23 VITALS — BP 112/66 | HR 68 | Ht <= 58 in | Wt 169.0 lb

## 2024-05-23 DIAGNOSIS — G8929 Other chronic pain: Secondary | ICD-10-CM | POA: Diagnosis not present

## 2024-05-23 DIAGNOSIS — I10 Essential (primary) hypertension: Secondary | ICD-10-CM

## 2024-05-23 DIAGNOSIS — M5442 Lumbago with sciatica, left side: Secondary | ICD-10-CM

## 2024-05-23 DIAGNOSIS — E782 Mixed hyperlipidemia: Secondary | ICD-10-CM

## 2024-05-23 NOTE — Progress Notes (Signed)
 Established Patient Office Visit  Subjective:  Patient ID: Angela Bass, female    DOB: [birth year]  Age: 83 y.o. MRN: [id number]  Chief Complaint  Patient presents with   Follow-up    10 day follow up    Patient comes in for follow-up of her back pain.  She has completed her prednisone  burst and muscle relaxer and she is feeling much better today.  She occasionally feels heaviness in her left leg, but there is no more pain.  She had an MRI in 2023 which showed multilevel spondylosis but no central canal or foraminal stenosis. Will send a referral for physical therapy.    No other concerns at this time.   Past Medical History:  Diagnosis Date   DDD (degenerative disc disease), lumbar    walks with cane   Uterine cancer (HCC)    55 years ago    Past Surgical History:  Procedure Laterality Date   ABDOMINAL HYSTERECTOMY     BREAST EXCISIONAL BIOPSY Left 2011   Neg   ESOPHAGOGASTRODUODENOSCOPY N/A 12/14/2023   Procedure: ESOPHAGOGASTRODUODENOSCOPY (EGD);  Surgeon: Marnee Sink, MD;  Location: [facility] ENDOSCOPY;  Service: Endoscopy;  Laterality: N/A;   INSERTION OF MESH  12/15/2023   Procedure: INSERTION OF MESH;  Surgeon: Alben Alma, MD;  Location: [facility] ORS;  Service: General;;   XI ROBOTIC ASSISTED HIATAL HERNIA REPAIR N/A 12/15/2023   Procedure: XI ROBOTIC ASSISTED PARAESOPHAGEAL;  Surgeon: Alben Alma, MD;  Location: [facility] ORS;  Service: General;  Laterality: N/A;    Social History   Socioeconomic History   Marital status: Widowed    Spouse name: Not on file   Number of children: Not on file   Years of education: Not on file   Highest education level: Not on file  Occupational History   Not on file  Tobacco Use   Smoking status: Never   Smokeless tobacco: Never  Vaping Use   Vaping status: Never Used  Substance and Sexual Activity   Alcohol use: Not Currently   Drug use: Never   Sexual activity: Not on file  Other Topics Concern   Not on file  Social  History Narrative   Not on file   Social Drivers of Health   Financial Resource Strain: Not on file  Food Insecurity: No Food Insecurity (12/14/2023)   Hunger Vital Sign    Worried About Running Out of Food in the Last Year: Never true    Ran Out of Food in the Last Year: Never true  Transportation Needs: No Transportation Needs (12/14/2023)   PRAPARE - Administrator, Civil Service (Medical): No    Lack of Transportation (Non-Medical): No  Physical Activity: Not on file  Stress: Not on file  Social Connections: Not on file  Intimate Partner Violence: Not At Risk (12/14/2023)   Humiliation, Afraid, Rape, and Kick questionnaire    Fear of Current or Ex-Partner: No    Emotionally Abused: No    Physically Abused: No    Sexually Abused: No    Family History  Problem Relation Age of Onset   Breast cancer Mother 88    No Known Allergies  Outpatient Medications Prior to Visit  Medication Sig   acetaminophen  (TYLENOL ) 500 MG tablet Take 500 mg by mouth every 6 (six) hours as needed.   ascorbic acid (VITAMIN C) 500 MG tablet Take 500 mg by mouth daily.   doxycycline  (VIBRA -TABS) 100 MG tablet Take 100 mg by mouth daily.  gabapentin  (NEURONTIN ) 300 MG capsule Take 1 capsule (300 mg total) by mouth 2 (two) times daily.   Multiple Vitamin (MULTIVITAMIN) tablet Take 1 tablet by mouth daily.   simvastatin  (ZOCOR ) 20 MG tablet Take 1 tablet (20 mg total) by mouth daily.   predniSONE  (DELTASONE ) 20 MG tablet Take 2 tablets (40 mg total) by mouth daily with breakfast. (Patient not taking: Reported on 05/23/2024)   No facility-administered medications prior to visit.    Review of Systems  Constitutional: Negative.  Negative for chills and weight loss.  HENT: Negative.  Negative for hearing loss, sinus pain and sore throat.   Eyes: Negative.   Respiratory: Negative.  Negative for cough and shortness of breath.   Cardiovascular: Negative.  Negative for chest pain, palpitations  and leg swelling.  Gastrointestinal: Negative.  Negative for abdominal pain, constipation, diarrhea, heartburn, nausea and vomiting.  Genitourinary: Negative.  Negative for dysuria and flank pain.  Musculoskeletal: Negative.  Negative for joint pain and myalgias.  Skin: Negative.   Neurological: Negative.  Negative for dizziness, tingling, sensory change and headaches.  Endo/Heme/Allergies: Negative.   Psychiatric/Behavioral: Negative.  Negative for depression and suicidal ideas. The patient is not nervous/anxious.        Objective:   BP 112/66   Pulse 68   Ht 4\' 7"  (1.397 m)   Wt 169 lb (76.7 kg)   SpO2 94%   BMI 39.28 kg/m   Vitals:   05/23/24 1119  BP: 112/66  Pulse: 68  Height: 4\' 7"  (1.397 m)  Weight: 169 lb (76.7 kg)  SpO2: 94%  BMI (Calculated): 39.28    Physical Exam Vitals and nursing note reviewed.  Constitutional:      Appearance: Normal appearance.  HENT:     Head: Normocephalic and atraumatic.     Nose: Nose normal.     Mouth/Throat:     Mouth: Mucous membranes are moist.     Pharynx: Oropharynx is clear.  Eyes:     Conjunctiva/sclera: Conjunctivae normal.     Pupils: Pupils are equal, round, and reactive to light.  Cardiovascular:     Rate and Rhythm: Normal rate and regular rhythm.     Pulses: Normal pulses.     Heart sounds: Normal heart sounds. No murmur heard. Pulmonary:     Effort: Pulmonary effort is normal.     Breath sounds: Normal breath sounds. No wheezing.  Abdominal:     General: Bowel sounds are normal.     Palpations: Abdomen is soft.     Tenderness: There is no abdominal tenderness. There is no right CVA tenderness or left CVA tenderness.  Musculoskeletal:        General: Normal range of motion.     Cervical back: Normal range of motion.     Right lower leg: No edema.     Left lower leg: No edema.  Skin:    General: Skin is warm and dry.  Neurological:     General: No focal deficit present.     Mental Status: She is alert  and oriented to person, place, and time.  Psychiatric:        Mood and Affect: Mood normal.        Behavior: Behavior normal.      No results found for any visits on 05/23/24.  Recent Results (from the past 2160 hours)  CMP14+EGFR     Status: Abnormal   Collection Time: 05/03/24 10:25 AM  Result Value Ref Range   Glucose 117 (  H) 70 - 99 mg/dL   BUN 15 8 - 27 mg/dL   Creatinine, Ser 4.09 0.57 - 1.00 mg/dL   eGFR 66 >81 XB/JYN/8.29   BUN/Creatinine Ratio 17 12 - 28   Sodium 141 134 - 144 mmol/L   Potassium 4.8 3.5 - 5.2 mmol/L   Chloride 102 96 - 106 mmol/L   CO2 22 20 - 29 mmol/L   Calcium 9.9 8.7 - 10.3 mg/dL   Total Protein 6.7 6.0 - 8.5 g/dL   Albumin  4.4 3.8 - 4.8 g/dL   Globulin, Total 2.3 1.5 - 4.5 g/dL   Bilirubin Total 0.4 0.0 - 1.2 mg/dL   Alkaline Phosphatase 86 44 - 121 IU/L   AST 23 0 - 40 IU/L   ALT 13 0 - 32 IU/L  Hemoglobin A1c     Status: Abnormal   Collection Time: 05/15/24  8:49 AM  Result Value Ref Range   Hgb A1c MFr Bld 5.8 (H) 4.8 - 5.6 %    Comment:          Prediabetes: 5.7 - 6.4          Diabetes: >6.4          Glycemic control for adults with diabetes: <7.0    Est. average glucose Bld gHb Est-mCnc 120 mg/dL  CBC With Diff/Platelet     Status: Abnormal   Collection Time: 05/15/24  8:49 AM  Result Value Ref Range   WBC 7.9 3.4 - 10.8 x10E3/uL   RBC 4.15 3.77 - 5.28 x10E6/uL   Hemoglobin 14.0 11.1 - 15.9 g/dL   Hematocrit 56.2 13.0 - 46.6 %   MCV 103 (H) 79 - 97 fL   MCH 33.7 (H) 26.6 - 33.0 pg   MCHC 32.8 31.5 - 35.7 g/dL   RDW 86.5 78.4 - 69.6 %   Platelets 218 150 - 450 x10E3/uL   Neutrophils 65 Not Estab. %   Lymphs 18 Not Estab. %   Monocytes 11 Not Estab. %   Eos 5 Not Estab. %   Basos 1 Not Estab. %   Neutrophils Absolute 5.1 1.4 - 7.0 x10E3/uL   Lymphocytes Absolute 1.4 0.7 - 3.1 x10E3/uL   Monocytes Absolute 0.9 0.1 - 0.9 x10E3/uL   EOS (ABSOLUTE) 0.4 0.0 - 0.4 x10E3/uL   Basophils Absolute 0.1 0.0 - 0.2 x10E3/uL   Immature  Granulocytes 0 Not Estab. %   Immature Grans (Abs) 0.0 0.0 - 0.1 x10E3/uL  Lipid panel     Status: None   Collection Time: 05/15/24  8:49 AM  Result Value Ref Range   Cholesterol, Total 191 100 - 199 mg/dL   Triglycerides 295 0 - 149 mg/dL   HDL 75 >28 mg/dL   VLDL Cholesterol Cal 25 5 - 40 mg/dL   LDL Chol Calc (NIH) 91 0 - 99 mg/dL   Chol/HDL Ratio 2.5 0.0 - 4.4 ratio    Comment:                                   T. Chol/HDL Ratio                                             Men  Women  1/2 Avg.Risk  3.4    3.3                                   Avg.Risk  5.0    4.4                                2X Avg.Risk  9.6    7.1                                3X Avg.Risk 23.4   11.0       Assessment & Plan:  Continue current medications.  Set up with physical therapy for chronic back pain with left-sided sciatica. Problem List Items Addressed This Visit     Mixed hyperlipidemia   Chronic bilateral low back pain with left-sided sciatica   Relevant Orders   Ambulatory referral to Physical Therapy   Essential hypertension - Primary    Return in about 3 months (around 08/23/2024).   Total time spent: 25 minutes  Aisha Hove, MD  05/23/2024   This document may have been prepared by [facility] Voice Recognition software and as such may include unintentional dictation errors.

## 2024-05-24 ENCOUNTER — Ambulatory Visit: Admitting: Internal Medicine

## 2024-05-29 ENCOUNTER — Other Ambulatory Visit: Payer: Self-pay | Admitting: Internal Medicine

## 2024-07-08 ENCOUNTER — Other Ambulatory Visit: Payer: Self-pay | Admitting: Internal Medicine

## 2024-07-17 ENCOUNTER — Other Ambulatory Visit: Payer: Self-pay | Admitting: Internal Medicine

## 2024-07-17 MED ORDER — DOXYCYCLINE HYCLATE 100 MG PO TABS: 100.0000 mg | ORAL_TABLET | Freq: Every day | ORAL | 1 refills | Status: DC

## 2024-08-07 ENCOUNTER — Other Ambulatory Visit: Payer: Self-pay | Admitting: Internal Medicine

## 2024-08-28 ENCOUNTER — Other Ambulatory Visit

## 2024-08-28 DIAGNOSIS — I10 Essential (primary) hypertension: Secondary | ICD-10-CM | POA: Diagnosis not present

## 2024-08-29 ENCOUNTER — Ambulatory Visit: Payer: Self-pay | Admitting: Internal Medicine

## 2024-08-29 LAB — CMP14+EGFR
ALT: 12 IU/L (ref 0–32)
AST: 17 IU/L (ref 0–40)
Albumin: 4.1 g/dL (ref 3.7–4.7)
Alkaline Phosphatase: 83 IU/L (ref 44–121)
BUN/Creatinine Ratio: 16 (ref 12–28)
BUN: 14 mg/dL (ref 8–27)
Bilirubin Total: 0.4 mg/dL (ref 0.0–1.2)
CO2: 22 mmol/L (ref 20–29)
Calcium: 9.4 mg/dL (ref 8.7–10.3)
Chloride: 104 mmol/L (ref 96–106)
Creatinine, Ser: 0.86 mg/dL (ref 0.57–1.00)
Globulin, Total: 2.3 g/dL (ref 1.5–4.5)
Glucose: 119 mg/dL — ABNORMAL HIGH (ref 70–99)
Potassium: 4.5 mmol/L (ref 3.5–5.2)
Sodium: 143 mmol/L (ref 134–144)
Total Protein: 6.4 g/dL (ref 6.0–8.5)
eGFR: 68 mL/min/1.73 (ref >59)

## 2024-08-30 ENCOUNTER — Ambulatory Visit (INDEPENDENT_AMBULATORY_CARE_PROVIDER_SITE_OTHER): Admitting: Internal Medicine

## 2024-08-30 ENCOUNTER — Encounter: Payer: Self-pay | Admitting: Internal Medicine

## 2024-08-30 VITALS — BP 126/78 | HR 70 | Ht <= 58 in | Wt 170.6 lb

## 2024-08-30 DIAGNOSIS — I10 Essential (primary) hypertension: Secondary | ICD-10-CM

## 2024-08-30 DIAGNOSIS — G8929 Other chronic pain: Secondary | ICD-10-CM | POA: Diagnosis not present

## 2024-08-30 DIAGNOSIS — E782 Mixed hyperlipidemia: Secondary | ICD-10-CM | POA: Diagnosis not present

## 2024-08-30 DIAGNOSIS — M5442 Lumbago with sciatica, left side: Secondary | ICD-10-CM | POA: Diagnosis not present

## 2024-08-30 DIAGNOSIS — R7309 Other abnormal glucose: Secondary | ICD-10-CM | POA: Insufficient documentation

## 2024-08-30 MED ORDER — COVID-19 MRNA VAC-TRIS(PFIZER) 30 MCG/0.3ML IM SUSY: 0.3000 mL | PREFILLED_SYRINGE | Freq: Once | INTRAMUSCULAR | 0 refills | Status: AC

## 2024-08-30 NOTE — Progress Notes (Signed)
 Established Patient Office Visit  Subjective:  Patient ID: Angela Bass, female    DOB: 1941/09/02  Age: 83 y.o. MRN: [id number]  Chief Complaint  Patient presents with   Follow-up    3 month follow up    Patient comes in for her follow-up today accompanied by her daughter.  She is generally feeling well and is no longer suffering from low back pain and left-sided sciatica.  Patient reports that she was not called or contacted by the physical therapy, however her symptoms had resolved since she took prednisone .  Wants to wait on her physical therapy for now. She will get her COVID-vaccine, will send in a prescription. Patient had some labs done but missed hemoglobin A1c and lipids will order today.    No other concerns at this time.   Past Medical History:  Diagnosis Date   DDD (degenerative disc disease), lumbar    walks with cane   Uterine cancer (HCC)    55 years ago    Past Surgical History:  Procedure Laterality Date   ABDOMINAL HYSTERECTOMY     BREAST EXCISIONAL BIOPSY Left 2011   Neg   ESOPHAGOGASTRODUODENOSCOPY N/A 12/14/2023   Procedure: ESOPHAGOGASTRODUODENOSCOPY (EGD);  Surgeon: Jinny Carmine, MD;  Location: [facility] ENDOSCOPY;  Service: Endoscopy;  Laterality: N/A;   INSERTION OF MESH  12/15/2023   Procedure: INSERTION OF MESH;  Surgeon: Jordis Laneta FALCON, MD;  Location: [facility] ORS;  Service: General;;   XI ROBOTIC ASSISTED HIATAL HERNIA REPAIR N/A 12/15/2023   Procedure: XI ROBOTIC ASSISTED PARAESOPHAGEAL;  Surgeon: Jordis Laneta FALCON, MD;  Location: [facility] ORS;  Service: General;  Laterality: N/A;    Social History   Socioeconomic History   Marital status: Widowed    Spouse name: Not on file   Number of children: Not on file   Years of education: Not on file   Highest education level: Not on file  Occupational History   Not on file  Tobacco Use   Smoking status: Never   Smokeless tobacco: Never  Vaping Use   Vaping status: Never Used  Substance and Sexual  Activity   Alcohol use: Not Currently   Drug use: Never   Sexual activity: Not on file  Other Topics Concern   Not on file  Social History Narrative   Not on file   Social Drivers of Health   Financial Resource Strain: Not on file  Food Insecurity: No Food Insecurity (12/14/2023)   Hunger Vital Sign    Worried About Running Out of Food in the Last Year: Never true    Ran Out of Food in the Last Year: Never true  Transportation Needs: No Transportation Needs (12/14/2023)   PRAPARE - Administrator, Civil Service (Medical): No    Lack of Transportation (Non-Medical): No  Physical Activity: Not on file  Stress: Not on file  Social Connections: Not on file  Intimate Partner Violence: Not At Risk (12/14/2023)   Humiliation, Afraid, Rape, and Kick questionnaire    Fear of Current or Ex-Partner: No    Emotionally Abused: No    Physically Abused: No    Sexually Abused: No    Family History  Problem Relation Age of Onset   Breast cancer Mother 4    No Known Allergies  Outpatient Medications Prior to Visit  Medication Sig   acetaminophen  (TYLENOL ) 500 MG tablet Take 500 mg by mouth every 6 (six) hours as needed.   doxycycline  (VIBRA -TABS) 100 MG tablet Take  1 tablet by mouth once daily   gabapentin  (NEURONTIN ) 300 MG capsule Take 1 capsule (300 mg total) by mouth 2 (two) times daily.   Multiple Vitamin (MULTIVITAMIN) tablet Take 1 tablet by mouth daily.   simvastatin  (ZOCOR ) 20 MG tablet Take 1 tablet (20 mg total) by mouth daily.   ascorbic acid (VITAMIN C) 500 MG tablet Take 500 mg by mouth daily. (Patient not taking: Reported on 08/30/2024)   predniSONE  (DELTASONE ) 20 MG tablet Take 2 tablets (40 mg total) by mouth daily with breakfast. (Patient not taking: Reported on 08/30/2024)   No facility-administered medications prior to visit.    Review of Systems  Constitutional: Negative.  Negative for chills, fever and malaise/fatigue.  HENT: Negative.  Negative for  congestion and sore throat.   Eyes: Negative.  Negative for blurred vision and pain.  Respiratory: Negative.  Negative for cough and shortness of breath.   Cardiovascular: Negative.  Negative for chest pain, palpitations and leg swelling.  Gastrointestinal: Negative.  Negative for abdominal pain, blood in stool, constipation, diarrhea, heartburn, melena, nausea and vomiting.  Genitourinary: Negative.  Negative for dysuria, flank pain, frequency and urgency.  Musculoskeletal: Negative.  Negative for joint pain and myalgias.  Skin: Negative.   Neurological: Negative.  Negative for dizziness, tingling, sensory change, weakness and headaches.  Endo/Heme/Allergies: Negative.   Psychiatric/Behavioral: Negative.  Negative for depression and suicidal ideas. The patient is not nervous/anxious.        Objective:   BP 126/78   Pulse 70   Ht 4' 7 (1.397 m)   Wt 170 lb 9.6 oz (77.4 kg)   SpO2 97%   BMI 39.65 kg/m   Vitals:   08/30/24 1013  BP: 126/78  Pulse: 70  Height: 4' 7 (1.397 m)  Weight: 170 lb 9.6 oz (77.4 kg)  SpO2: 97%  BMI (Calculated): 39.65    Physical Exam Vitals and nursing note reviewed.  Constitutional:      Appearance: Normal appearance.  HENT:     Head: Normocephalic and atraumatic.     Nose: Nose normal.     Mouth/Throat:     Mouth: Mucous membranes are moist.     Pharynx: Oropharynx is clear.  Eyes:     Conjunctiva/sclera: Conjunctivae normal.     Pupils: Pupils are equal, round, and reactive to light.  Cardiovascular:     Rate and Rhythm: Normal rate and regular rhythm.     Pulses: Normal pulses.     Heart sounds: Normal heart sounds. No murmur heard. Pulmonary:     Effort: Pulmonary effort is normal.     Breath sounds: Normal breath sounds. No wheezing.  Abdominal:     General: Bowel sounds are normal.     Palpations: Abdomen is soft.     Tenderness: There is no abdominal tenderness. There is no right CVA tenderness or left CVA tenderness.   Musculoskeletal:        General: Normal range of motion.     Cervical back: Normal range of motion.     Right lower leg: No edema.     Left lower leg: No edema.  Skin:    General: Skin is warm and dry.  Neurological:     General: No focal deficit present.     Mental Status: She is alert and oriented to person, place, and time.  Psychiatric:        Mood and Affect: Mood normal.        Behavior: Behavior normal.  No results found for any visits on 08/30/24.  Recent Results (from the past 2160 hours)  CMP14+EGFR     Status: Abnormal   Collection Time: 08/28/24  8:47 AM  Result Value Ref Range   Glucose 119 (H) 70 - 99 mg/dL   BUN 14 8 - 27 mg/dL   Creatinine, Ser 9.13 0.57 - 1.00 mg/dL   eGFR 68 >40 fO/fpw/8.26   BUN/Creatinine Ratio 16 12 - 28   Sodium 143 134 - 144 mmol/L   Potassium 4.5 3.5 - 5.2 mmol/L   Chloride 104 96 - 106 mmol/L   CO2 22 20 - 29 mmol/L   Calcium 9.4 8.7 - 10.3 mg/dL   Total Protein 6.4 6.0 - 8.5 g/dL   Albumin  4.1 3.7 - 4.7 g/dL   Globulin, Total 2.3 1.5 - 4.5 g/dL   Bilirubin Total 0.4 0.0 - 1.2 mg/dL   Alkaline Phosphatase 83 44 - 121 IU/L    Comment: **Effective September 02, 2024 Alkaline Phosphatase**   reference interval will be changing to:              Age                Female          Female           0 -  5 days         47 - 127       47 - 127           6 - 10 days         29 - 242       29 - 242          11 - 20 days        109 - 357      109 - 357          21 - 30 days         94 - 494       94 - 494           1 -  2 months      149 - 539      149 - 539           3 -  6 months      131 - 452      131 - 452           7 - 11 months      117 - 401      117 - 401   12 months -  6 years       158 - 369      158 - 369           7 - 12 years       150 - 409      150 - 409               13 years       156 - 435       78 - 227               14 years       114 - 375       64 - 161               15 years        88 - 279  56 - 134                16 years        74 - 207       51 - 121               17 years        63 - 161       47 - 113          18 - 20 years        51 - 125       42 - 106          21 - 50 years         47 - 123       41 - 116          51 - 80 years        49 - 135       51 - 125              >80 years        48 - 129       48 - 129    AST 17 0 - 40 IU/L   ALT 12 0 - 32 IU/L      Assessment & Plan:   Problem List Items Addressed This Visit     Mixed hyperlipidemia   Relevant Orders   Lipid Panel w/o Chol/HDL Ratio   Chronic bilateral low back pain with left-sided sciatica   Essential hypertension - Primary   Elevated glucose   Relevant Orders   Hemoglobin A1c    Follow up 3 months  Total time spent: 30 minutes  FERNAND FREDY RAMAN, MD  08/30/2024   This document may have been prepared by [employer] Voice Recognition software and as such may include unintentional dictation errors.

## 2024-08-31 LAB — LIPID PANEL W/O CHOL/HDL RATIO
Cholesterol, Total: 175 mg/dL (ref 100–199)
HDL: 67 mg/dL (ref >39)
LDL Chol Calc (NIH): 84 mg/dL (ref 0–99)
Triglycerides: 141 mg/dL (ref 0–149)
VLDL Cholesterol Cal: 24 mg/dL (ref 5–40)

## 2024-08-31 LAB — HEMOGLOBIN A1C
Est. average glucose Bld gHb Est-mCnc: 114 mg/dL
Hgb A1c MFr Bld: 5.6 % (ref 4.8–5.6)

## 2024-09-02 ENCOUNTER — Ambulatory Visit: Payer: Self-pay | Admitting: Internal Medicine

## 2024-09-03 NOTE — Progress Notes (Signed)
Pt informed

## 2024-09-09 DIAGNOSIS — X58XXXA Exposure to other specified factors, initial encounter: Secondary | ICD-10-CM | POA: Diagnosis not present

## 2024-09-09 DIAGNOSIS — S81812A Laceration without foreign body, left lower leg, initial encounter: Secondary | ICD-10-CM | POA: Diagnosis not present

## 2024-09-18 DIAGNOSIS — X500XXA Overexertion from strenuous movement or load, initial encounter: Secondary | ICD-10-CM | POA: Diagnosis not present

## 2024-09-18 DIAGNOSIS — S81812D Laceration without foreign body, left lower leg, subsequent encounter: Secondary | ICD-10-CM | POA: Diagnosis not present

## 2024-09-18 DIAGNOSIS — Z4802 Encounter for removal of sutures: Secondary | ICD-10-CM | POA: Diagnosis not present

## 2024-09-19 ENCOUNTER — Encounter: Payer: Self-pay | Admitting: Internal Medicine

## 2024-09-19 ENCOUNTER — Ambulatory Visit (INDEPENDENT_AMBULATORY_CARE_PROVIDER_SITE_OTHER): Admitting: Internal Medicine

## 2024-09-19 DIAGNOSIS — Z23 Encounter for immunization: Secondary | ICD-10-CM

## 2024-09-19 NOTE — Progress Notes (Signed)
 Flu shot today

## 2024-10-07 ENCOUNTER — Ambulatory Visit: Admitting: Internal Medicine

## 2024-10-22 ENCOUNTER — Ambulatory Visit (INDEPENDENT_AMBULATORY_CARE_PROVIDER_SITE_OTHER): Admitting: Internal Medicine

## 2024-10-22 ENCOUNTER — Encounter: Payer: Self-pay | Admitting: Internal Medicine

## 2024-10-22 VITALS — BP 130/82 | HR 91 | Ht <= 58 in | Wt 169.4 lb

## 2024-10-22 DIAGNOSIS — E782 Mixed hyperlipidemia: Secondary | ICD-10-CM | POA: Diagnosis not present

## 2024-10-22 DIAGNOSIS — L03116 Cellulitis of left lower limb: Secondary | ICD-10-CM

## 2024-10-22 DIAGNOSIS — Z131 Encounter for screening for diabetes mellitus: Secondary | ICD-10-CM

## 2024-10-22 DIAGNOSIS — I10 Essential (primary) hypertension: Secondary | ICD-10-CM | POA: Diagnosis not present

## 2024-10-22 DIAGNOSIS — T148XXD Other injury of unspecified body region, subsequent encounter: Secondary | ICD-10-CM | POA: Diagnosis not present

## 2024-10-22 DIAGNOSIS — L02416 Cutaneous abscess of left lower limb: Secondary | ICD-10-CM | POA: Insufficient documentation

## 2024-10-22 MED ORDER — DOXYCYCLINE HYCLATE 100 MG PO TABS: 100.0000 mg | ORAL_TABLET | Freq: Every day | ORAL | 0 refills | Status: AC

## 2024-10-22 NOTE — Progress Notes (Signed)
 Established Patient Office Visit  Subjective:  Patient ID: Angela Bass, female    DOB: 1941-01-02  Age: 83 y.o. MRN: [id number]  Chief Complaint  Patient presents with   Follow-up    Patient is here today for follow up. She reports being seen on 09/09/24 at urgent care for laceration on left lower leg. They applied stiches, updated her tetanus vaccine and gave her Keflex. She had the stitches removed on 10/08/24 and at that time there was no signs of infection. Today upon exam, she has crusting and signs of cellulitis. Denies pain and tenderness, denies neuropathy, and denies difficulty walking. Reports using neosporin and hydrogen peroxide on it to keep it clean. Reinforced keeping it clean and dry and light dressing so it does not rub on her clothing.But Not to use Peroxide. She reports using suppressive antibiotic therapy with Doxycycline  for her Rosacea once daily. Will send in additional doxycycline  to take twice a day for the next 10 days. Will send referral to wound clinic.  Otherwise she reports feeling well and has no additional complaints at this time.    No other concerns at this time.   Past Medical History:  Diagnosis Date   DDD (degenerative disc disease), lumbar    walks with cane   Uterine cancer (HCC)    55 years ago    Past Surgical History:  Procedure Laterality Date   ABDOMINAL HYSTERECTOMY     BREAST EXCISIONAL BIOPSY Left 2011   Neg   ESOPHAGOGASTRODUODENOSCOPY N/A 12/14/2023   Procedure: ESOPHAGOGASTRODUODENOSCOPY (EGD);  Surgeon: Jinny Carmine, MD;  Location: [facility] ENDOSCOPY;  Service: Endoscopy;  Laterality: N/A;   INSERTION OF MESH  12/15/2023   Procedure: INSERTION OF MESH;  Surgeon: Jordis Laneta FALCON, MD;  Location: [facility] ORS;  Service: General;;   XI ROBOTIC ASSISTED HIATAL HERNIA REPAIR N/A 12/15/2023   Procedure: XI ROBOTIC ASSISTED PARAESOPHAGEAL;  Surgeon: Jordis Laneta FALCON, MD;  Location: [facility] ORS;  Service: General;  Laterality: N/A;    Social  History   Socioeconomic History   Marital status: Widowed    Spouse name: Not on file   Number of children: Not on file   Years of education: Not on file   Highest education level: Not on file  Occupational History   Not on file  Tobacco Use   Smoking status: Never   Smokeless tobacco: Never  Vaping Use   Vaping status: Never Used  Substance and Sexual Activity   Alcohol use: Not Currently   Drug use: Never   Sexual activity: Not on file  Other Topics Concern   Not on file  Social History Narrative   Not on file   Social Drivers of Health   Financial Resource Strain: Not on file  Food Insecurity: No Food Insecurity (12/14/2023)   Hunger Vital Sign    Worried About Running Out of Food in the Last Year: Never true    Ran Out of Food in the Last Year: Never true  Transportation Needs: No Transportation Needs (12/14/2023)   PRAPARE - Administrator, Civil Service (Medical): No    Lack of Transportation (Non-Medical): No  Physical Activity: Not on file  Stress: Not on file  Social Connections: Not on file  Intimate Partner Violence: Not At Risk (12/14/2023)   Humiliation, Afraid, Rape, and Kick questionnaire    Fear of Current or Ex-Partner: No    Emotionally Abused: No    Physically Abused: No    Sexually Abused: No  Family History  Problem Relation Age of Onset   Breast cancer Mother 77    No Known Allergies  Outpatient Medications Prior to Visit  Medication Sig   acetaminophen  (TYLENOL ) 500 MG tablet Take 500 mg by mouth every 6 (six) hours as needed.   doxycycline  (VIBRA -TABS) 100 MG tablet Take 1 tablet by mouth once daily   gabapentin  (NEURONTIN ) 300 MG capsule Take 1 capsule (300 mg total) by mouth 2 (two) times daily.   Multiple Vitamin (MULTIVITAMIN) tablet Take 1 tablet by mouth daily.   simvastatin  (ZOCOR ) 20 MG tablet Take 1 tablet (20 mg total) by mouth daily.   ascorbic acid (VITAMIN C) 500 MG tablet Take 500 mg by mouth daily.  (Patient not taking: Reported on 10/22/2024)   [DISCONTINUED] predniSONE  (DELTASONE ) 20 MG tablet Take 2 tablets (40 mg total) by mouth daily with breakfast. (Patient not taking: Reported on 10/22/2024)   No facility-administered medications prior to visit.    Review of Systems  Constitutional: Negative.  Negative for chills, fever and malaise/fatigue.  HENT: Negative.  Negative for congestion and sore throat.   Eyes: Negative.  Negative for blurred vision and pain.  Respiratory: Negative.  Negative for cough and shortness of breath.   Cardiovascular: Negative.  Negative for chest pain, palpitations and leg swelling.  Gastrointestinal: Negative.  Negative for abdominal pain, blood in stool, constipation, diarrhea, heartburn, melena, nausea and vomiting.  Genitourinary: Negative.  Negative for dysuria, flank pain, frequency and urgency.  Musculoskeletal: Negative.  Negative for joint pain and myalgias.  Skin:        Left lower leg wound   Neurological: Negative.  Negative for dizziness, tingling, sensory change, weakness and headaches.  Endo/Heme/Allergies: Negative.   Psychiatric/Behavioral: Negative.  Negative for depression and suicidal ideas. The patient is not nervous/anxious.        Objective:   BP 130/82   Pulse 91   Ht 4' 7 (1.397 m)   Wt 169 lb 6.4 oz (76.8 kg)   SpO2 97%   BMI 39.37 kg/m   Vitals:   10/22/24 1300  BP: 130/82  Pulse: 91  Height: 4' 7 (1.397 m)  Weight: 169 lb 6.4 oz (76.8 kg)  SpO2: 97%  BMI (Calculated): 39.37    Physical Exam Vitals and nursing note reviewed.  Constitutional:      Appearance: Normal appearance.  HENT:     Head: Normocephalic and atraumatic.     Nose: Nose normal.     Mouth/Throat:     Mouth: Mucous membranes are moist.     Pharynx: Oropharynx is clear.  Eyes:     Conjunctiva/sclera: Conjunctivae normal.     Pupils: Pupils are equal, round, and reactive to light.  Cardiovascular:     Rate and Rhythm: Normal rate and  regular rhythm.     Pulses: Normal pulses.     Heart sounds: Normal heart sounds. No murmur heard. Pulmonary:     Effort: Pulmonary effort is normal.     Breath sounds: Normal breath sounds. No wheezing.  Abdominal:     General: Bowel sounds are normal.     Palpations: Abdomen is soft.     Tenderness: There is no abdominal tenderness. There is no right CVA tenderness or left CVA tenderness.  Musculoskeletal:        General: Normal range of motion.     Cervical back: Normal range of motion.     Right lower leg: No edema.     Left lower leg:  No edema.  Skin:    General: Skin is warm and dry.     Findings: Erythema, rash and wound present. Rash is crusting.         Comments: Crusting, erythema on left lower leg wound.  Neurological:     General: No focal deficit present.     Mental Status: She is alert and oriented to person, place, and time.  Psychiatric:        Mood and Affect: Mood normal.        Behavior: Behavior normal.      No results found for any visits on 10/22/24.  Recent Results (from the past 2160 hours)  CMP14+EGFR     Status: Abnormal   Collection Time: 08/28/24  8:47 AM  Result Value Ref Range   Glucose 119 (H) 70 - 99 mg/dL   BUN 14 8 - 27 mg/dL   Creatinine, Ser 9.13 0.57 - 1.00 mg/dL   eGFR 68 >40 fO/fpw/8.26   BUN/Creatinine Ratio 16 12 - 28   Sodium 143 134 - 144 mmol/L   Potassium 4.5 3.5 - 5.2 mmol/L   Chloride 104 96 - 106 mmol/L   CO2 22 20 - 29 mmol/L   Calcium 9.4 8.7 - 10.3 mg/dL   Total Protein 6.4 6.0 - 8.5 g/dL   Albumin  4.1 3.7 - 4.7 g/dL   Globulin, Total 2.3 1.5 - 4.5 g/dL   Bilirubin Total 0.4 0.0 - 1.2 mg/dL   Alkaline Phosphatase 83 44 - 121 IU/L    Comment: **Effective September 02, 2024 Alkaline Phosphatase**   reference interval will be changing to:              Age                Female          Female           0 -  5 days         47 - 127       47 - 127           6 - 10 days         29 - 242       29 - 242          11 - 20  days        109 - 357      109 - 357          21 - 30 days         94 - 494       94 - 494           1 -  2 months      149 - 539      149 - 539           3 -  6 months      131 - 452      131 - 452           7 - 11 months      117 - 401      117 - 401   12 months -  6 years       158 - 369      158 - 369           7 - 12 years       150 - 409      150 - 409  13 years       156 - 435       78 - 227               14 years       114 - 375       64 - 161               15 years        88 - 279       56 - 134               16 years        74 - 207       51 - 121               17 years        63 - 161       47 - 113          18 - 20 years        51 - 125       42 - 106          21 - 50 years         47 - 123       41 - 116          51 - 80 years        49 - 135       51 - 125              >80 years        48 - 129       48 - 129    AST 17 0 - 40 IU/L   ALT 12 0 - 32 IU/L  Hemoglobin A1c     Status: None   Collection Time: 08/30/24 11:04 AM  Result Value Ref Range   Hgb A1c MFr Bld 5.6 4.8 - 5.6 %    Comment:          Prediabetes: 5.7 - 6.4          Diabetes: >6.4          Glycemic control for adults with diabetes: <7.0    Est. average glucose Bld gHb Est-mCnc 114 mg/dL  Lipid Panel w/o Chol/HDL Ratio     Status: None   Collection Time: 08/30/24 11:04 AM  Result Value Ref Range   Cholesterol, Total 175 100 - 199 mg/dL   Triglycerides 858 0 - 149 mg/dL   HDL 67 >60 mg/dL   VLDL Cholesterol Cal 24 5 - 40 mg/dL   LDL Chol Calc (NIH) 84 0 - 99 mg/dL      Assessment & Plan:  Continue doxycycline  but increase to twice daily for 10 days. Referral sent wound clinic. Problem List Items Addressed This Visit     Mixed hyperlipidemia   Essential hypertension - Primary   Cellulitis and abscess of left lower extremity   Relevant Medications   doxycycline  (VIBRA -TABS) 100 MG tablet   Other Relevant Orders   AMB referral to wound care center    No follow-ups on file.    Total time spent: 25 minutes. This time includes review of previous notes and results and patient face to face interaction during today's visit.    FERNAND FREDY RAMAN, MD  10/22/2024   This document may have been prepared by [facility] Voice Recognition software and as such may include unintentional dictation errors.

## 2024-11-07 ENCOUNTER — Other Ambulatory Visit: Payer: Self-pay | Admitting: Internal Medicine

## 2024-11-07 DIAGNOSIS — L03116 Cellulitis of left lower limb: Secondary | ICD-10-CM

## 2024-11-11 ENCOUNTER — Other Ambulatory Visit: Payer: Self-pay | Admitting: Internal Medicine

## 2024-11-11 DIAGNOSIS — T148XXD Other injury of unspecified body region, subsequent encounter: Secondary | ICD-10-CM

## 2024-11-11 MED ORDER — DOXYCYCLINE HYCLATE 100 MG PO TABS: 100.0000 mg | ORAL_TABLET | Freq: Every day | ORAL | 1 refills | Status: AC

## 2024-11-13 ENCOUNTER — Encounter: Attending: Physician Assistant | Admitting: Physician Assistant

## 2024-11-13 DIAGNOSIS — I87332 Chronic venous hypertension (idiopathic) with ulcer and inflammation of left lower extremity: Secondary | ICD-10-CM | POA: Diagnosis not present

## 2024-11-13 DIAGNOSIS — L97822 Non-pressure chronic ulcer of other part of left lower leg with fat layer exposed: Secondary | ICD-10-CM | POA: Diagnosis not present

## 2024-11-13 DIAGNOSIS — I10 Essential (primary) hypertension: Secondary | ICD-10-CM | POA: Diagnosis not present

## 2024-11-13 DIAGNOSIS — T8131XA Disruption of external operation (surgical) wound, not elsewhere classified, initial encounter: Secondary | ICD-10-CM | POA: Insufficient documentation

## 2024-11-19 ENCOUNTER — Encounter: Attending: Physician Assistant

## 2024-11-19 DIAGNOSIS — T8131XA Disruption of external operation (surgical) wound, not elsewhere classified, initial encounter: Secondary | ICD-10-CM | POA: Insufficient documentation

## 2024-11-19 DIAGNOSIS — L97822 Non-pressure chronic ulcer of other part of left lower leg with fat layer exposed: Secondary | ICD-10-CM | POA: Insufficient documentation

## 2024-11-19 DIAGNOSIS — I10 Essential (primary) hypertension: Secondary | ICD-10-CM | POA: Insufficient documentation

## 2024-11-19 DIAGNOSIS — X58XXXA Exposure to other specified factors, initial encounter: Secondary | ICD-10-CM | POA: Insufficient documentation

## 2024-11-19 DIAGNOSIS — I87332 Chronic venous hypertension (idiopathic) with ulcer and inflammation of left lower extremity: Secondary | ICD-10-CM | POA: Insufficient documentation

## 2024-11-27 ENCOUNTER — Encounter: Admitting: Internal Medicine

## 2024-11-27 DIAGNOSIS — I87332 Chronic venous hypertension (idiopathic) with ulcer and inflammation of left lower extremity: Secondary | ICD-10-CM | POA: Diagnosis not present

## 2024-11-29 ENCOUNTER — Encounter: Payer: Self-pay | Admitting: Internal Medicine

## 2024-11-29 ENCOUNTER — Ambulatory Visit: Admitting: Internal Medicine

## 2024-11-29 VITALS — BP 122/74 | HR 81 | Ht <= 58 in | Wt 171.2 lb

## 2024-11-29 DIAGNOSIS — Z0001 Encounter for general adult medical examination with abnormal findings: Secondary | ICD-10-CM | POA: Diagnosis not present

## 2024-11-29 DIAGNOSIS — Z0000 Encounter for general adult medical examination without abnormal findings: Secondary | ICD-10-CM | POA: Insufficient documentation

## 2024-11-29 DIAGNOSIS — L719 Rosacea, unspecified: Secondary | ICD-10-CM | POA: Diagnosis not present

## 2024-11-29 DIAGNOSIS — E66812 Obesity, class 2: Secondary | ICD-10-CM | POA: Insufficient documentation

## 2024-11-29 DIAGNOSIS — E782 Mixed hyperlipidemia: Secondary | ICD-10-CM

## 2024-11-29 DIAGNOSIS — T148XXD Other injury of unspecified body region, subsequent encounter: Secondary | ICD-10-CM | POA: Diagnosis not present

## 2024-11-29 DIAGNOSIS — I10 Essential (primary) hypertension: Secondary | ICD-10-CM

## 2024-11-29 DIAGNOSIS — Z6839 Body mass index (BMI) 39.0-39.9, adult: Secondary | ICD-10-CM

## 2024-11-29 DIAGNOSIS — R7309 Other abnormal glucose: Secondary | ICD-10-CM | POA: Diagnosis not present

## 2024-11-29 NOTE — Progress Notes (Signed)
 Established Patient Office Visit  Subjective:  Patient ID: Angela Bass, female    DOB: 1941-01-28  Age: 83 y.o. MRN: [id number]  Chief Complaint  Patient presents with   Follow-up    3 month follow up    Patient is here today for Medicare AWV and routine FU. She reports feeling well today.   She states she still has the wound on her left leg but she is seeing wound clinic frequently and they are doing weekly bandage changes. She reports that it has been getting smaller and is almost healed. Patient has dressing and wrap in place today.  She reports her chronic low back pain is bothering her right now and is taking tylenol  as needed. She reports the back pain has not been keeping her up at night. She still does not want to do physical therapy at this. Discussed staying mobile and preventing falls. Recommend patient reach out if her pain worsens or fails to improve with conservative treatment and we will reassess PT at that time.  She denies any chest pain, shortness of breath, palpitations, abdominal distress. 6CIT was 4. PHQ-2 and GAD-7 score 0. Last mammogram was 04/2024, DEXA scan 01/2024    No other concerns at this time.   Past Medical History:  Diagnosis Date   DDD (degenerative disc disease), lumbar    walks with cane   Uterine cancer (HCC)    55 years ago    Past Surgical History:  Procedure Laterality Date   ABDOMINAL HYSTERECTOMY     BREAST EXCISIONAL BIOPSY Left 2011   Neg   ESOPHAGOGASTRODUODENOSCOPY N/A 12/14/2023   Procedure: ESOPHAGOGASTRODUODENOSCOPY (EGD);  Surgeon: Jinny Carmine, MD;  Location: [facility] ENDOSCOPY;  Service: Endoscopy;  Laterality: N/A;   INSERTION OF MESH  12/15/2023   Procedure: INSERTION OF MESH;  Surgeon: Jordis Laneta FALCON, MD;  Location: [facility] ORS;  Service: General;;   XI ROBOTIC ASSISTED HIATAL HERNIA REPAIR N/A 12/15/2023   Procedure: XI ROBOTIC ASSISTED PARAESOPHAGEAL;  Surgeon: Jordis Laneta FALCON, MD;  Location: [facility] ORS;  Service:  General;  Laterality: N/A;    Social History   Socioeconomic History   Marital status: Widowed    Spouse name: Not on file   Number of children: Not on file   Years of education: Not on file   Highest education level: Not on file  Occupational History   Not on file  Tobacco Use   Smoking status: Never   Smokeless tobacco: Never  Vaping Use   Vaping status: Never Used  Substance and Sexual Activity   Alcohol use: Not Currently   Drug use: Never   Sexual activity: Not on file  Other Topics Concern   Not on file  Social History Narrative   Not on file   Social Drivers of Health   Tobacco Use: Low Risk (11/29/2024)   Patient History    Smoking Tobacco Use: Never    Smokeless Tobacco Use: Never    Passive Exposure: Not on file  Financial Resource Strain: Not on file  Food Insecurity: No Food Insecurity (12/14/2023)   Hunger Vital Sign    Worried About Running Out of Food in the Last Year: Never true    Ran Out of Food in the Last Year: Never true  Transportation Needs: No Transportation Needs (12/14/2023)   PRAPARE - Administrator, Civil Service (Medical): No    Lack of Transportation (Non-Medical): No  Physical Activity: Not on file  Stress: Not on file  Social Connections: Not on file  Intimate Partner Violence: Not At Risk (12/14/2023)   Humiliation, Afraid, Rape, and Kick questionnaire    Fear of Current or Ex-Partner: No    Emotionally Abused: No    Physically Abused: No    Sexually Abused: No  Depression (PHQ2-9): Low Risk (12/25/2023)   Depression (PHQ2-9)    PHQ-2 Score: 0  Alcohol Screen: Not on file  Housing: Low Risk (12/14/2023)   Housing Stability Vital Sign    Unable to Pay for Housing in the Last Year: No    Number of Times Moved in the Last Year: 0    Homeless in the Last Year: No  Utilities: Not At Risk (12/14/2023)   [employer] Utilities    Threatened with loss of utilities: No  Health Literacy: Not on file    Family History  Problem  Relation Age of Onset   Breast cancer Mother 31    Allergies[1]  Show/hide medication list[2]  Review of Systems  Constitutional: Negative.  Negative for chills, fever and malaise/fatigue.  HENT: Negative.  Negative for congestion and sore throat.   Eyes: Negative.  Negative for blurred vision and pain.  Respiratory: Negative.  Negative for cough and shortness of breath.   Cardiovascular: Negative.  Negative for chest pain, palpitations and leg swelling.  Gastrointestinal: Negative.  Negative for abdominal pain, blood in stool, constipation, diarrhea, heartburn, melena, nausea and vomiting.  Genitourinary: Negative.  Negative for dysuria, flank pain, frequency and urgency.  Musculoskeletal:  Positive for joint pain. Negative for myalgias.  Skin: Negative.        Healing left leg wound  Neurological: Negative.  Negative for dizziness, tingling, sensory change, weakness and headaches.  Endo/Heme/Allergies: Negative.   Psychiatric/Behavioral: Negative.  Negative for depression and suicidal ideas. The patient is not nervous/anxious.        Objective:   BP 122/74   Pulse 81   Ht 4' 7 (1.397 m)   Wt 171 lb 3.2 oz (77.7 kg)   SpO2 99%   BMI 39.79 kg/m   Vitals:   11/29/24 0913  BP: 122/74  Pulse: 81  Height: 4' 7 (1.397 m)  Weight: 171 lb 3.2 oz (77.7 kg)  SpO2: 99%  BMI (Calculated): 39.79    Physical Exam Vitals and nursing note reviewed.  Constitutional:      Appearance: Normal appearance.  HENT:     Head: Normocephalic and atraumatic.     Nose: Nose normal.     Mouth/Throat:     Mouth: Mucous membranes are moist.     Pharynx: Oropharynx is clear.  Eyes:     Conjunctiva/sclera: Conjunctivae normal.     Pupils: Pupils are equal, round, and reactive to light.  Cardiovascular:     Rate and Rhythm: Normal rate and regular rhythm.     Pulses: Normal pulses.     Heart sounds: Normal heart sounds. No murmur heard. Pulmonary:     Effort: Pulmonary effort is  normal.     Breath sounds: Normal breath sounds. No wheezing.  Abdominal:     General: Bowel sounds are normal.     Palpations: Abdomen is soft.     Tenderness: There is no abdominal tenderness. There is no right CVA tenderness or left CVA tenderness.  Musculoskeletal:        General: Normal range of motion.     Cervical back: Normal range of motion.     Right lower leg: No edema.     Left lower  leg: No edema.  Skin:    General: Skin is warm and dry.  Neurological:     General: No focal deficit present.     Mental Status: She is alert and oriented to person, place, and time.  Psychiatric:        Mood and Affect: Mood normal.        Behavior: Behavior normal.      No results found for any visits on 11/29/24.  No results found for this or any previous visit (from the past 2160 hours).    Assessment & Plan:  Continue medications as prescribed. Reinforced healthy diet and staying active. Continue wound care appointments as scheduled. Check routine labs today and FU with patient on results. Problem List Items Addressed This Visit       Cardiovascular and Mediastinum   Essential hypertension   Relevant Orders   CMP14+EGFR     Other   Mixed hyperlipidemia   Relevant Orders   Lipid Panel w/o Chol/HDL Ratio   Rosacea   Elevated glucose   Relevant Orders   Hemoglobin A1c   Delayed wound healing   Relevant Orders   CBC with Diff   Medicare annual wellness visit, subsequent - Primary   Class 2 severe obesity due to excess calories with serious comorbidity and body mass index (BMI) of 39.0 to 39.9 in adult    Return in about 3 months (around 02/27/2025).   Total time spent: 30 minutes. This time includes review of previous notes and results and patient face to face interaction during today's visit.    FERNAND FREDY RAMAN, MD  11/29/2024   This document may have been prepared by [facility] Voice Recognition software and as such may include unintentional dictation errors.      [1] No Known Allergies [2]  Outpatient Medications Prior to Visit  Medication Sig   acetaminophen  (TYLENOL ) 500 MG tablet Take 500 mg by mouth every 6 (six) hours as needed.   doxycycline  (VIBRA -TABS) 100 MG tablet Take 1 tablet (100 mg total) by mouth daily.   gabapentin  (NEURONTIN ) 300 MG capsule Take 1 capsule (300 mg total) by mouth 2 (two) times daily.   Multiple Vitamin (MULTIVITAMIN) tablet Take 1 tablet by mouth daily.   simvastatin  (ZOCOR ) 20 MG tablet Take 1 tablet (20 mg total) by mouth daily.   ascorbic acid (VITAMIN C) 500 MG tablet Take 500 mg by mouth daily. (Patient not taking: Reported on 11/29/2024)   No facility-administered medications prior to visit.

## 2024-11-30 LAB — CBC WITH DIFFERENTIAL/PLATELET
Basophils Absolute: 0.1 x10E3/uL (ref 0.0–0.2)
Basos: 1 %
EOS (ABSOLUTE): 0.4 x10E3/uL (ref 0.0–0.4)
Eos: 4 %
Hematocrit: 34.6 % (ref 34.0–46.6)
Hemoglobin: 11.7 g/dL (ref 11.1–15.9)
Immature Grans (Abs): 0 x10E3/uL (ref 0.0–0.1)
Immature Granulocytes: 0 %
Lymphocytes Absolute: 1.4 x10E3/uL (ref 0.7–3.1)
Lymphs: 14 %
MCH: 34.1 pg — ABNORMAL HIGH (ref 26.6–33.0)
MCHC: 33.8 g/dL (ref 31.5–35.7)
MCV: 101 fL — ABNORMAL HIGH (ref 79–97)
Monocytes Absolute: 0.9 x10E3/uL (ref 0.1–0.9)
Monocytes: 9 %
Neutrophils Absolute: 7.1 x10E3/uL — ABNORMAL HIGH (ref 1.4–7.0)
Neutrophils: 72 %
Platelets: 288 x10E3/uL (ref 150–450)
RBC: 3.43 x10E6/uL — ABNORMAL LOW (ref 3.77–5.28)
RDW: 12.7 % (ref 11.7–15.4)
WBC: 9.9 x10E3/uL (ref 3.4–10.8)

## 2024-11-30 LAB — LIPID PANEL W/O CHOL/HDL RATIO
Cholesterol, Total: 176 mg/dL (ref 100–199)
HDL: 70 mg/dL (ref >39)
LDL Chol Calc (NIH): 87 mg/dL (ref 0–99)
Triglycerides: 105 mg/dL (ref 0–149)
VLDL Cholesterol Cal: 19 mg/dL (ref 5–40)

## 2024-11-30 LAB — CMP14+EGFR
ALT: 10 IU/L (ref 0–32)
AST: 19 IU/L (ref 0–40)
Albumin: 4.3 g/dL (ref 3.7–4.7)
Alkaline Phosphatase: 76 IU/L (ref 48–129)
BUN/Creatinine Ratio: 20 (ref 12–28)
BUN: 20 mg/dL (ref 8–27)
Bilirubin Total: 0.6 mg/dL (ref 0.0–1.2)
CO2: 23 mmol/L (ref 20–29)
Calcium: 9.4 mg/dL (ref 8.7–10.3)
Chloride: 102 mmol/L (ref 96–106)
Creatinine, Ser: 0.99 mg/dL (ref 0.57–1.00)
Globulin, Total: 2 g/dL (ref 1.5–4.5)
Glucose: 96 mg/dL (ref 70–99)
Potassium: 5.2 mmol/L (ref 3.5–5.2)
Sodium: 137 mmol/L (ref 134–144)
Total Protein: 6.3 g/dL (ref 6.0–8.5)
eGFR: 57 mL/min/1.73 — ABNORMAL LOW (ref >59)

## 2024-11-30 LAB — HEMOGLOBIN A1C
Est. average glucose Bld gHb Est-mCnc: 114 mg/dL
Hgb A1c MFr Bld: 5.6 % (ref 4.8–5.6)

## 2024-12-02 ENCOUNTER — Ambulatory Visit: Payer: Self-pay | Admitting: Internal Medicine

## 2024-12-02 NOTE — Progress Notes (Signed)
 Patient notified

## 2024-12-05 ENCOUNTER — Encounter: Admitting: Internal Medicine

## 2024-12-05 DIAGNOSIS — I87332 Chronic venous hypertension (idiopathic) with ulcer and inflammation of left lower extremity: Secondary | ICD-10-CM | POA: Diagnosis not present

## 2024-12-06 ENCOUNTER — Ambulatory Visit: Payer: Self-pay | Admitting: Internal Medicine

## 2024-12-06 ENCOUNTER — Other Ambulatory Visit

## 2024-12-06 DIAGNOSIS — I10 Essential (primary) hypertension: Secondary | ICD-10-CM

## 2024-12-06 DIAGNOSIS — D649 Anemia, unspecified: Secondary | ICD-10-CM

## 2024-12-06 DIAGNOSIS — R195 Other fecal abnormalities: Secondary | ICD-10-CM

## 2024-12-06 LAB — CBC WITH DIFFERENTIAL/PLATELET
Basophils Absolute: 0.1 x10E3/uL (ref 0.0–0.2)
Basos: 1 %
EOS (ABSOLUTE): 0.4 x10E3/uL (ref 0.0–0.4)
Eos: 6 %
Hematocrit: 35.2 % (ref 34.0–46.6)
Hemoglobin: 11.5 g/dL (ref 11.1–15.9)
Immature Grans (Abs): 0 x10E3/uL (ref 0.0–0.1)
Immature Granulocytes: 0 %
Lymphocytes Absolute: 1.3 x10E3/uL (ref 0.7–3.1)
Lymphs: 19 %
MCH: 33.7 pg — ABNORMAL HIGH (ref 26.6–33.0)
MCHC: 32.7 g/dL (ref 31.5–35.7)
MCV: 103 fL — ABNORMAL HIGH (ref 79–97)
Monocytes Absolute: 0.7 x10E3/uL (ref 0.1–0.9)
Monocytes: 11 %
Neutrophils Absolute: 4.2 x10E3/uL (ref 1.4–7.0)
Neutrophils: 63 %
Platelets: 328 x10E3/uL (ref 150–450)
RBC: 3.41 x10E6/uL — ABNORMAL LOW (ref 3.77–5.28)
RDW: 13.1 % (ref 11.7–15.4)
WBC: 6.6 x10E3/uL (ref 3.4–10.8)

## 2024-12-06 LAB — POC HEMOCCULT BLD/STL (HOME/3-CARD/SCREEN)
Card #2 Fecal Occult Blod, POC: POSITIVE
Card #3 Fecal Occult Blood, POC: NEGATIVE
Fecal Occult Blood, POC: NEGATIVE

## 2024-12-06 NOTE — Progress Notes (Signed)
 Patient notified

## 2024-12-11 ENCOUNTER — Encounter

## 2024-12-11 DIAGNOSIS — I87332 Chronic venous hypertension (idiopathic) with ulcer and inflammation of left lower extremity: Secondary | ICD-10-CM | POA: Diagnosis not present

## 2024-12-16 ENCOUNTER — Other Ambulatory Visit: Payer: Self-pay | Admitting: Internal Medicine

## 2024-12-16 DIAGNOSIS — G8929 Other chronic pain: Secondary | ICD-10-CM

## 2024-12-18 ENCOUNTER — Encounter

## 2024-12-18 DIAGNOSIS — I87332 Chronic venous hypertension (idiopathic) with ulcer and inflammation of left lower extremity: Secondary | ICD-10-CM | POA: Diagnosis not present

## 2024-12-25 ENCOUNTER — Encounter: Attending: Physician Assistant | Admitting: Physician Assistant

## 2024-12-25 DIAGNOSIS — T8131XA Disruption of external operation (surgical) wound, not elsewhere classified, initial encounter: Secondary | ICD-10-CM | POA: Diagnosis present

## 2024-12-25 DIAGNOSIS — L97822 Non-pressure chronic ulcer of other part of left lower leg with fat layer exposed: Secondary | ICD-10-CM | POA: Diagnosis not present

## 2024-12-25 DIAGNOSIS — I10 Essential (primary) hypertension: Secondary | ICD-10-CM | POA: Diagnosis not present

## 2024-12-25 DIAGNOSIS — X58XXXA Exposure to other specified factors, initial encounter: Secondary | ICD-10-CM | POA: Diagnosis not present

## 2024-12-25 DIAGNOSIS — I87332 Chronic venous hypertension (idiopathic) with ulcer and inflammation of left lower extremity: Secondary | ICD-10-CM | POA: Diagnosis not present

## 2025-01-01 ENCOUNTER — Encounter: Admitting: Physician Assistant

## 2025-02-28 ENCOUNTER — Ambulatory Visit: Admitting: Internal Medicine
